# Patient Record
Sex: Female | Born: 1935 | Race: White | Hispanic: No | Marital: Married | Smoking: Never smoker
Health system: Southern US, Community
[De-identification: ages and names within clinical notes are randomized; demographics above are authoritative.]

## PROBLEM LIST (undated history)

## (undated) DIAGNOSIS — K5900 Constipation, unspecified: Secondary | ICD-10-CM

## (undated) DIAGNOSIS — E785 Hyperlipidemia, unspecified: Secondary | ICD-10-CM

## (undated) DIAGNOSIS — I639 Cerebral infarction, unspecified: Secondary | ICD-10-CM

## (undated) DIAGNOSIS — K4090 Unilateral inguinal hernia, without obstruction or gangrene, not specified as recurrent: Secondary | ICD-10-CM

## (undated) DIAGNOSIS — I10 Essential (primary) hypertension: Secondary | ICD-10-CM

## (undated) DIAGNOSIS — I2510 Atherosclerotic heart disease of native coronary artery without angina pectoris: Secondary | ICD-10-CM

## (undated) DIAGNOSIS — Z952 Presence of prosthetic heart valve: Secondary | ICD-10-CM

## (undated) DIAGNOSIS — E119 Type 2 diabetes mellitus without complications: Secondary | ICD-10-CM

## (undated) DIAGNOSIS — J45909 Unspecified asthma, uncomplicated: Secondary | ICD-10-CM

## (undated) DIAGNOSIS — M1990 Unspecified osteoarthritis, unspecified site: Secondary | ICD-10-CM

## (undated) DIAGNOSIS — H409 Unspecified glaucoma: Secondary | ICD-10-CM

## (undated) DIAGNOSIS — Z950 Presence of cardiac pacemaker: Secondary | ICD-10-CM

## (undated) DIAGNOSIS — Z9109 Other allergy status, other than to drugs and biological substances: Secondary | ICD-10-CM

## (undated) HISTORY — PX: HERNIA REPAIR: SHX51

## (undated) HISTORY — DX: Essential (primary) hypertension: I10

## (undated) HISTORY — DX: Presence of prosthetic heart valve: Z95.2

## (undated) HISTORY — DX: Hyperlipidemia, unspecified: E78.5

## (undated) HISTORY — DX: Type 2 diabetes mellitus without complications: E11.9

## (undated) HISTORY — PX: EYE SURGERY: SHX253

## (undated) HISTORY — DX: Unilateral inguinal hernia, without obstruction or gangrene, not specified as recurrent: K40.90

## (undated) HISTORY — PX: TONSILLECTOMY: SUR1361

## (undated) HISTORY — PX: EXPLORATION POST OPERATIVE OPEN HEART: SHX5061

## (undated) HISTORY — DX: Unspecified glaucoma: H40.9

---

## 2004-08-03 HISTORY — PX: AORTIC VALVE REPLACEMENT: SHX41

## 2004-08-03 HISTORY — PX: CORONARY ARTERY BYPASS GRAFT: SHX141

## 2011-08-06 DIAGNOSIS — R05 Cough: Secondary | ICD-10-CM | POA: Diagnosis not present

## 2011-08-06 DIAGNOSIS — I10 Essential (primary) hypertension: Secondary | ICD-10-CM | POA: Diagnosis not present

## 2011-08-06 DIAGNOSIS — Z950 Presence of cardiac pacemaker: Secondary | ICD-10-CM | POA: Diagnosis not present

## 2011-08-06 DIAGNOSIS — I442 Atrioventricular block, complete: Secondary | ICD-10-CM | POA: Diagnosis not present

## 2011-08-06 DIAGNOSIS — I495 Sick sinus syndrome: Secondary | ICD-10-CM | POA: Diagnosis not present

## 2011-08-31 DIAGNOSIS — H4011X0 Primary open-angle glaucoma, stage unspecified: Secondary | ICD-10-CM | POA: Diagnosis not present

## 2011-08-31 DIAGNOSIS — H409 Unspecified glaucoma: Secondary | ICD-10-CM | POA: Diagnosis not present

## 2011-09-15 DIAGNOSIS — M549 Dorsalgia, unspecified: Secondary | ICD-10-CM | POA: Diagnosis not present

## 2011-09-15 DIAGNOSIS — R000 Tachycardia, unspecified: Secondary | ICD-10-CM | POA: Diagnosis not present

## 2011-09-15 DIAGNOSIS — E7800 Pure hypercholesterolemia, unspecified: Secondary | ICD-10-CM | POA: Diagnosis not present

## 2011-09-15 DIAGNOSIS — R030 Elevated blood-pressure reading, without diagnosis of hypertension: Secondary | ICD-10-CM | POA: Diagnosis not present

## 2011-09-15 DIAGNOSIS — R7309 Other abnormal glucose: Secondary | ICD-10-CM | POA: Diagnosis not present

## 2011-09-23 DIAGNOSIS — H4011X0 Primary open-angle glaucoma, stage unspecified: Secondary | ICD-10-CM | POA: Diagnosis not present

## 2011-09-23 DIAGNOSIS — H409 Unspecified glaucoma: Secondary | ICD-10-CM | POA: Diagnosis not present

## 2011-10-12 DIAGNOSIS — R030 Elevated blood-pressure reading, without diagnosis of hypertension: Secondary | ICD-10-CM | POA: Diagnosis not present

## 2011-10-12 DIAGNOSIS — I10 Essential (primary) hypertension: Secondary | ICD-10-CM | POA: Diagnosis not present

## 2011-10-12 DIAGNOSIS — M25519 Pain in unspecified shoulder: Secondary | ICD-10-CM | POA: Diagnosis not present

## 2011-10-12 DIAGNOSIS — Z9889 Other specified postprocedural states: Secondary | ICD-10-CM | POA: Diagnosis not present

## 2011-10-13 DIAGNOSIS — H4011X0 Primary open-angle glaucoma, stage unspecified: Secondary | ICD-10-CM | POA: Diagnosis not present

## 2011-10-13 DIAGNOSIS — H409 Unspecified glaucoma: Secondary | ICD-10-CM | POA: Diagnosis not present

## 2011-11-03 DIAGNOSIS — H409 Unspecified glaucoma: Secondary | ICD-10-CM | POA: Diagnosis not present

## 2011-11-03 DIAGNOSIS — H4011X0 Primary open-angle glaucoma, stage unspecified: Secondary | ICD-10-CM | POA: Diagnosis not present

## 2011-11-03 DIAGNOSIS — H35379 Puckering of macula, unspecified eye: Secondary | ICD-10-CM | POA: Diagnosis not present

## 2011-11-10 DIAGNOSIS — Z950 Presence of cardiac pacemaker: Secondary | ICD-10-CM | POA: Diagnosis not present

## 2011-11-10 DIAGNOSIS — Z954 Presence of other heart-valve replacement: Secondary | ICD-10-CM | POA: Diagnosis not present

## 2011-11-10 DIAGNOSIS — I495 Sick sinus syndrome: Secondary | ICD-10-CM | POA: Diagnosis not present

## 2011-12-14 DIAGNOSIS — H409 Unspecified glaucoma: Secondary | ICD-10-CM | POA: Diagnosis not present

## 2011-12-14 DIAGNOSIS — Z961 Presence of intraocular lens: Secondary | ICD-10-CM | POA: Diagnosis not present

## 2011-12-14 DIAGNOSIS — H4011X0 Primary open-angle glaucoma, stage unspecified: Secondary | ICD-10-CM | POA: Diagnosis not present

## 2012-05-17 DIAGNOSIS — H409 Unspecified glaucoma: Secondary | ICD-10-CM | POA: Diagnosis not present

## 2012-05-17 DIAGNOSIS — H4011X0 Primary open-angle glaucoma, stage unspecified: Secondary | ICD-10-CM | POA: Diagnosis not present

## 2012-08-09 ENCOUNTER — Ambulatory Visit
Admission: RE | Admit: 2012-08-09 | Discharge: 2012-08-09 | Disposition: A | Discharge status: Home / Self Care | Payer: Medicare Other | Source: Ambulatory Visit | Attending: Cardiology | Admitting: Cardiology

## 2012-08-09 ENCOUNTER — Other Ambulatory Visit: Payer: Self-pay | Admitting: Cardiology

## 2012-08-09 DIAGNOSIS — E785 Hyperlipidemia, unspecified: Secondary | ICD-10-CM

## 2012-08-09 DIAGNOSIS — Z954 Presence of other heart-valve replacement: Secondary | ICD-10-CM | POA: Diagnosis not present

## 2012-08-09 DIAGNOSIS — Z950 Presence of cardiac pacemaker: Secondary | ICD-10-CM | POA: Diagnosis not present

## 2012-08-09 DIAGNOSIS — I359 Nonrheumatic aortic valve disorder, unspecified: Secondary | ICD-10-CM | POA: Diagnosis not present

## 2012-08-09 DIAGNOSIS — E119 Type 2 diabetes mellitus without complications: Secondary | ICD-10-CM | POA: Diagnosis not present

## 2012-08-09 DIAGNOSIS — I2510 Atherosclerotic heart disease of native coronary artery without angina pectoris: Secondary | ICD-10-CM | POA: Diagnosis not present

## 2012-08-09 DIAGNOSIS — I4891 Unspecified atrial fibrillation: Secondary | ICD-10-CM | POA: Diagnosis not present

## 2012-08-18 DIAGNOSIS — Z950 Presence of cardiac pacemaker: Secondary | ICD-10-CM | POA: Diagnosis not present

## 2012-08-18 DIAGNOSIS — I4891 Unspecified atrial fibrillation: Secondary | ICD-10-CM | POA: Diagnosis not present

## 2012-08-18 DIAGNOSIS — I2510 Atherosclerotic heart disease of native coronary artery without angina pectoris: Secondary | ICD-10-CM | POA: Diagnosis not present

## 2012-08-18 DIAGNOSIS — I10 Essential (primary) hypertension: Secondary | ICD-10-CM | POA: Diagnosis not present

## 2012-09-01 DIAGNOSIS — E119 Type 2 diabetes mellitus without complications: Secondary | ICD-10-CM | POA: Diagnosis not present

## 2012-09-01 DIAGNOSIS — Z1331 Encounter for screening for depression: Secondary | ICD-10-CM | POA: Diagnosis not present

## 2012-09-01 DIAGNOSIS — Z954 Presence of other heart-valve replacement: Secondary | ICD-10-CM | POA: Diagnosis not present

## 2012-09-01 DIAGNOSIS — M21619 Bunion of unspecified foot: Secondary | ICD-10-CM | POA: Diagnosis not present

## 2012-09-06 DIAGNOSIS — M779 Enthesopathy, unspecified: Secondary | ICD-10-CM | POA: Diagnosis not present

## 2012-09-06 DIAGNOSIS — M19079 Primary osteoarthritis, unspecified ankle and foot: Secondary | ICD-10-CM | POA: Diagnosis not present

## 2012-09-06 DIAGNOSIS — M79609 Pain in unspecified limb: Secondary | ICD-10-CM | POA: Diagnosis not present

## 2012-09-06 DIAGNOSIS — M2010 Hallux valgus (acquired), unspecified foot: Secondary | ICD-10-CM | POA: Diagnosis not present

## 2012-11-16 DIAGNOSIS — H4011X0 Primary open-angle glaucoma, stage unspecified: Secondary | ICD-10-CM | POA: Diagnosis not present

## 2012-11-16 DIAGNOSIS — Z950 Presence of cardiac pacemaker: Secondary | ICD-10-CM | POA: Diagnosis not present

## 2012-11-16 DIAGNOSIS — R0789 Other chest pain: Secondary | ICD-10-CM | POA: Diagnosis not present

## 2012-11-16 DIAGNOSIS — I4891 Unspecified atrial fibrillation: Secondary | ICD-10-CM | POA: Diagnosis not present

## 2012-11-16 DIAGNOSIS — I10 Essential (primary) hypertension: Secondary | ICD-10-CM | POA: Diagnosis not present

## 2012-11-16 DIAGNOSIS — E785 Hyperlipidemia, unspecified: Secondary | ICD-10-CM | POA: Diagnosis not present

## 2012-11-16 DIAGNOSIS — I359 Nonrheumatic aortic valve disorder, unspecified: Secondary | ICD-10-CM | POA: Diagnosis not present

## 2012-11-16 DIAGNOSIS — Z954 Presence of other heart-valve replacement: Secondary | ICD-10-CM | POA: Diagnosis not present

## 2012-11-16 DIAGNOSIS — H409 Unspecified glaucoma: Secondary | ICD-10-CM | POA: Diagnosis not present

## 2012-11-16 DIAGNOSIS — E119 Type 2 diabetes mellitus without complications: Secondary | ICD-10-CM | POA: Diagnosis not present

## 2012-11-16 DIAGNOSIS — I2510 Atherosclerotic heart disease of native coronary artery without angina pectoris: Secondary | ICD-10-CM | POA: Diagnosis not present

## 2012-11-29 ENCOUNTER — Other Ambulatory Visit: Payer: Self-pay

## 2012-11-29 ENCOUNTER — Ambulatory Visit
Admission: RE | Admit: 2012-11-29 | Discharge: 2012-11-29 | Disposition: A | Discharge status: Home / Self Care | Payer: Medicare Other | Source: Ambulatory Visit

## 2012-11-29 DIAGNOSIS — I359 Nonrheumatic aortic valve disorder, unspecified: Secondary | ICD-10-CM | POA: Diagnosis not present

## 2012-11-29 DIAGNOSIS — E1169 Type 2 diabetes mellitus with other specified complication: Secondary | ICD-10-CM | POA: Diagnosis not present

## 2012-11-29 DIAGNOSIS — Z1231 Encounter for screening mammogram for malignant neoplasm of breast: Secondary | ICD-10-CM | POA: Diagnosis not present

## 2012-11-29 DIAGNOSIS — Z954 Presence of other heart-valve replacement: Secondary | ICD-10-CM | POA: Diagnosis not present

## 2012-11-29 DIAGNOSIS — I10 Essential (primary) hypertension: Secondary | ICD-10-CM | POA: Diagnosis not present

## 2012-11-29 DIAGNOSIS — Z1212 Encounter for screening for malignant neoplasm of rectum: Secondary | ICD-10-CM | POA: Diagnosis not present

## 2012-11-29 DIAGNOSIS — I4891 Unspecified atrial fibrillation: Secondary | ICD-10-CM | POA: Diagnosis not present

## 2012-11-29 DIAGNOSIS — M129 Arthropathy, unspecified: Secondary | ICD-10-CM | POA: Diagnosis not present

## 2012-11-29 DIAGNOSIS — Z0000 Encounter for general adult medical examination without abnormal findings: Secondary | ICD-10-CM | POA: Diagnosis not present

## 2012-11-29 DIAGNOSIS — Z950 Presence of cardiac pacemaker: Secondary | ICD-10-CM | POA: Diagnosis not present

## 2012-11-30 ENCOUNTER — Other Ambulatory Visit: Payer: Self-pay | Admitting: Internal Medicine

## 2012-11-30 DIAGNOSIS — R928 Other abnormal and inconclusive findings on diagnostic imaging of breast: Secondary | ICD-10-CM

## 2012-12-13 ENCOUNTER — Ambulatory Visit
Admission: RE | Admit: 2012-12-13 | Discharge: 2012-12-13 | Disposition: A | Discharge status: Home / Self Care | Payer: Medicare Other | Source: Ambulatory Visit | Attending: Internal Medicine | Admitting: Internal Medicine

## 2012-12-13 DIAGNOSIS — R928 Other abnormal and inconclusive findings on diagnostic imaging of breast: Secondary | ICD-10-CM | POA: Diagnosis not present

## 2013-01-11 DIAGNOSIS — M899 Disorder of bone, unspecified: Secondary | ICD-10-CM | POA: Diagnosis not present

## 2013-01-11 DIAGNOSIS — M949 Disorder of cartilage, unspecified: Secondary | ICD-10-CM | POA: Diagnosis not present

## 2013-02-14 DIAGNOSIS — E119 Type 2 diabetes mellitus without complications: Secondary | ICD-10-CM | POA: Diagnosis not present

## 2013-02-14 DIAGNOSIS — E785 Hyperlipidemia, unspecified: Secondary | ICD-10-CM | POA: Diagnosis not present

## 2013-02-14 DIAGNOSIS — Z954 Presence of other heart-valve replacement: Secondary | ICD-10-CM | POA: Diagnosis not present

## 2013-02-14 DIAGNOSIS — R0789 Other chest pain: Secondary | ICD-10-CM | POA: Diagnosis not present

## 2013-02-14 DIAGNOSIS — Z950 Presence of cardiac pacemaker: Secondary | ICD-10-CM | POA: Diagnosis not present

## 2013-02-14 DIAGNOSIS — I359 Nonrheumatic aortic valve disorder, unspecified: Secondary | ICD-10-CM | POA: Diagnosis not present

## 2013-02-14 DIAGNOSIS — I2510 Atherosclerotic heart disease of native coronary artery without angina pectoris: Secondary | ICD-10-CM | POA: Diagnosis not present

## 2013-02-14 DIAGNOSIS — I4891 Unspecified atrial fibrillation: Secondary | ICD-10-CM | POA: Diagnosis not present

## 2013-03-29 DIAGNOSIS — I10 Essential (primary) hypertension: Secondary | ICD-10-CM | POA: Diagnosis not present

## 2013-03-29 DIAGNOSIS — E1169 Type 2 diabetes mellitus with other specified complication: Secondary | ICD-10-CM | POA: Diagnosis not present

## 2013-03-29 DIAGNOSIS — Z6828 Body mass index (BMI) 28.0-28.9, adult: Secondary | ICD-10-CM | POA: Diagnosis not present

## 2013-03-29 DIAGNOSIS — Z23 Encounter for immunization: Secondary | ICD-10-CM | POA: Diagnosis not present

## 2013-03-29 DIAGNOSIS — E785 Hyperlipidemia, unspecified: Secondary | ICD-10-CM | POA: Diagnosis not present

## 2013-03-29 DIAGNOSIS — M810 Age-related osteoporosis without current pathological fracture: Secondary | ICD-10-CM | POA: Diagnosis not present

## 2013-04-25 DIAGNOSIS — I10 Essential (primary) hypertension: Secondary | ICD-10-CM | POA: Diagnosis not present

## 2013-04-25 DIAGNOSIS — M810 Age-related osteoporosis without current pathological fracture: Secondary | ICD-10-CM | POA: Diagnosis not present

## 2013-04-25 DIAGNOSIS — E119 Type 2 diabetes mellitus without complications: Secondary | ICD-10-CM | POA: Diagnosis not present

## 2013-04-25 DIAGNOSIS — Z6828 Body mass index (BMI) 28.0-28.9, adult: Secondary | ICD-10-CM | POA: Diagnosis not present

## 2013-05-09 DIAGNOSIS — R05 Cough: Secondary | ICD-10-CM | POA: Diagnosis not present

## 2013-05-09 DIAGNOSIS — Z6828 Body mass index (BMI) 28.0-28.9, adult: Secondary | ICD-10-CM | POA: Diagnosis not present

## 2013-05-22 DIAGNOSIS — E785 Hyperlipidemia, unspecified: Secondary | ICD-10-CM | POA: Diagnosis not present

## 2013-05-22 DIAGNOSIS — I2510 Atherosclerotic heart disease of native coronary artery without angina pectoris: Secondary | ICD-10-CM | POA: Diagnosis not present

## 2013-05-22 DIAGNOSIS — Z950 Presence of cardiac pacemaker: Secondary | ICD-10-CM | POA: Diagnosis not present

## 2013-05-22 DIAGNOSIS — E119 Type 2 diabetes mellitus without complications: Secondary | ICD-10-CM | POA: Diagnosis not present

## 2013-05-22 DIAGNOSIS — I4891 Unspecified atrial fibrillation: Secondary | ICD-10-CM | POA: Diagnosis not present

## 2013-05-22 DIAGNOSIS — I359 Nonrheumatic aortic valve disorder, unspecified: Secondary | ICD-10-CM | POA: Diagnosis not present

## 2013-05-22 DIAGNOSIS — Z954 Presence of other heart-valve replacement: Secondary | ICD-10-CM | POA: Diagnosis not present

## 2013-05-22 DIAGNOSIS — R0789 Other chest pain: Secondary | ICD-10-CM | POA: Diagnosis not present

## 2013-05-25 DIAGNOSIS — H4011X0 Primary open-angle glaucoma, stage unspecified: Secondary | ICD-10-CM | POA: Diagnosis not present

## 2013-05-25 DIAGNOSIS — H409 Unspecified glaucoma: Secondary | ICD-10-CM | POA: Diagnosis not present

## 2013-06-14 DIAGNOSIS — I10 Essential (primary) hypertension: Secondary | ICD-10-CM | POA: Diagnosis not present

## 2013-06-14 DIAGNOSIS — Z6828 Body mass index (BMI) 28.0-28.9, adult: Secondary | ICD-10-CM | POA: Diagnosis not present

## 2013-06-14 DIAGNOSIS — B029 Zoster without complications: Secondary | ICD-10-CM | POA: Diagnosis not present

## 2013-07-05 DIAGNOSIS — M810 Age-related osteoporosis without current pathological fracture: Secondary | ICD-10-CM | POA: Diagnosis not present

## 2013-07-05 DIAGNOSIS — I359 Nonrheumatic aortic valve disorder, unspecified: Secondary | ICD-10-CM | POA: Diagnosis not present

## 2013-07-05 DIAGNOSIS — Z6827 Body mass index (BMI) 27.0-27.9, adult: Secondary | ICD-10-CM | POA: Diagnosis not present

## 2013-07-05 DIAGNOSIS — K4090 Unilateral inguinal hernia, without obstruction or gangrene, not specified as recurrent: Secondary | ICD-10-CM | POA: Diagnosis not present

## 2013-07-05 DIAGNOSIS — E785 Hyperlipidemia, unspecified: Secondary | ICD-10-CM | POA: Diagnosis not present

## 2013-07-05 DIAGNOSIS — E1169 Type 2 diabetes mellitus with other specified complication: Secondary | ICD-10-CM | POA: Diagnosis not present

## 2013-07-05 DIAGNOSIS — I10 Essential (primary) hypertension: Secondary | ICD-10-CM | POA: Diagnosis not present

## 2013-07-12 DIAGNOSIS — Z950 Presence of cardiac pacemaker: Secondary | ICD-10-CM | POA: Diagnosis not present

## 2013-07-12 DIAGNOSIS — I4891 Unspecified atrial fibrillation: Secondary | ICD-10-CM | POA: Diagnosis not present

## 2013-07-12 DIAGNOSIS — R0789 Other chest pain: Secondary | ICD-10-CM | POA: Diagnosis not present

## 2013-07-12 DIAGNOSIS — E119 Type 2 diabetes mellitus without complications: Secondary | ICD-10-CM | POA: Diagnosis not present

## 2013-07-12 DIAGNOSIS — I2510 Atherosclerotic heart disease of native coronary artery without angina pectoris: Secondary | ICD-10-CM | POA: Diagnosis not present

## 2013-07-12 DIAGNOSIS — Z954 Presence of other heart-valve replacement: Secondary | ICD-10-CM | POA: Diagnosis not present

## 2013-07-12 DIAGNOSIS — E785 Hyperlipidemia, unspecified: Secondary | ICD-10-CM | POA: Diagnosis not present

## 2013-07-12 DIAGNOSIS — I359 Nonrheumatic aortic valve disorder, unspecified: Secondary | ICD-10-CM | POA: Diagnosis not present

## 2013-08-25 DIAGNOSIS — Z954 Presence of other heart-valve replacement: Secondary | ICD-10-CM | POA: Diagnosis not present

## 2013-08-25 DIAGNOSIS — E119 Type 2 diabetes mellitus without complications: Secondary | ICD-10-CM | POA: Diagnosis not present

## 2013-08-25 DIAGNOSIS — E785 Hyperlipidemia, unspecified: Secondary | ICD-10-CM | POA: Diagnosis not present

## 2013-08-25 DIAGNOSIS — I2510 Atherosclerotic heart disease of native coronary artery without angina pectoris: Secondary | ICD-10-CM | POA: Diagnosis not present

## 2013-08-25 DIAGNOSIS — Z950 Presence of cardiac pacemaker: Secondary | ICD-10-CM | POA: Diagnosis not present

## 2013-08-25 DIAGNOSIS — I359 Nonrheumatic aortic valve disorder, unspecified: Secondary | ICD-10-CM | POA: Diagnosis not present

## 2013-08-25 DIAGNOSIS — I4891 Unspecified atrial fibrillation: Secondary | ICD-10-CM | POA: Diagnosis not present

## 2013-09-12 DIAGNOSIS — M810 Age-related osteoporosis without current pathological fracture: Secondary | ICD-10-CM | POA: Diagnosis not present

## 2013-09-12 DIAGNOSIS — Z6828 Body mass index (BMI) 28.0-28.9, adult: Secondary | ICD-10-CM | POA: Diagnosis not present

## 2013-09-12 DIAGNOSIS — Z1331 Encounter for screening for depression: Secondary | ICD-10-CM | POA: Diagnosis not present

## 2013-09-12 DIAGNOSIS — E785 Hyperlipidemia, unspecified: Secondary | ICD-10-CM | POA: Diagnosis not present

## 2013-09-12 DIAGNOSIS — K4090 Unilateral inguinal hernia, without obstruction or gangrene, not specified as recurrent: Secondary | ICD-10-CM | POA: Diagnosis not present

## 2013-09-25 DIAGNOSIS — R918 Other nonspecific abnormal finding of lung field: Secondary | ICD-10-CM | POA: Diagnosis not present

## 2013-09-25 DIAGNOSIS — K4090 Unilateral inguinal hernia, without obstruction or gangrene, not specified as recurrent: Secondary | ICD-10-CM | POA: Diagnosis not present

## 2013-09-25 DIAGNOSIS — R198 Other specified symptoms and signs involving the digestive system and abdomen: Secondary | ICD-10-CM | POA: Diagnosis not present

## 2013-10-02 DIAGNOSIS — Z6828 Body mass index (BMI) 28.0-28.9, adult: Secondary | ICD-10-CM | POA: Diagnosis not present

## 2013-10-02 DIAGNOSIS — K4090 Unilateral inguinal hernia, without obstruction or gangrene, not specified as recurrent: Secondary | ICD-10-CM | POA: Diagnosis not present

## 2013-10-02 DIAGNOSIS — Z954 Presence of other heart-valve replacement: Secondary | ICD-10-CM | POA: Diagnosis not present

## 2013-10-02 DIAGNOSIS — E1169 Type 2 diabetes mellitus with other specified complication: Secondary | ICD-10-CM | POA: Diagnosis not present

## 2013-10-06 ENCOUNTER — Encounter (INDEPENDENT_AMBULATORY_CARE_PROVIDER_SITE_OTHER): Payer: Self-pay

## 2013-10-06 ENCOUNTER — Encounter (INDEPENDENT_AMBULATORY_CARE_PROVIDER_SITE_OTHER): Payer: Self-pay | Admitting: General Surgery

## 2013-10-06 ENCOUNTER — Ambulatory Visit (INDEPENDENT_AMBULATORY_CARE_PROVIDER_SITE_OTHER): Payer: Medicare Other | Admitting: General Surgery

## 2013-10-06 VITALS — BP 138/80 | HR 80 | Temp 99.1°F | Resp 12 | Ht 67.0 in | Wt 167.4 lb

## 2013-10-06 DIAGNOSIS — K4090 Unilateral inguinal hernia, without obstruction or gangrene, not specified as recurrent: Secondary | ICD-10-CM

## 2013-10-06 NOTE — Patient Instructions (Signed)
Treat the constipation as follows: Increase water intake, increase fiber in diet (raw fruits and vegetables), take 1 dose of MiraLAX daily, drink one glass of warm prune-juice every morning.  If this does not work, please let me know.    [facility] _______[facility]  INGUINAL HERNIA REPAIR: POST OP INSTRUCTIONS  Always review your discharge instruction sheet given to you by the facility where your surgery was performed. IF YOU HAVE DISABILITY OR FAMILY LEAVE FORMS, YOU MUST BRING THEM TO THE OFFICE FOR PROCESSING.   DO NOT GIVE THEM TO YOUR DOCTOR.  1. A  prescription for pain medication may be given to you upon discharge.  Take your pain medication as prescribed, if needed.  If narcotic pain medicine is not needed, then you may take acetaminophen (Tylenol) or ibuprofen (Advil) as needed. 2. Take your usually prescribed medications unless otherwise directed. 3. If you need a refill on your pain medication, please contact your pharmacy.  They will contact our office to request authorization. Prescriptions will not be filled after 5 pm or on week-ends. 4. You should follow a light diet the first 24 hours after arrival home, such as soup and crackers, etc.  Be sure to include lots of fluids daily.  Resume your normal diet the day after surgery. 5. Most patients will experience some swelling and bruising around the groin.  Ice packs and reclining will help.  Swelling and bruising can take several days to resolve.  6. It is common to experience some constipation if taking pain medication after surgery.  Increasing fluid intake and taking a stool softener (such as Colace) will usually help or prevent this problem from occurring.  A mild laxative (Milk of Magnesia or Miralax) should be taken according to package directions if there are no bowel movements after 48 hours. 7. Unless discharge instructions indicate otherwise, you may remove your bandages 72 hours after surgery, and you may shower  at that time.  You may have steri-strips (small skin tapes) in place directly over the incision.  These strips should be left on the skin for 7-10 days.  If your surgeon used skin glue on the incision, you may shower in 24 hours.  The glue will flake off over the next 2-3 weeks.  Any sutures or staples will be removed at the office during your follow-up visit. 8. ACTIVITIES:  You may resume regular (light) daily activities beginning the next day-such as daily self-care, walking, climbing stairs-gradually increasing activities as tolerated.  You may have sexual intercourse when it is comfortable.  Refrain from any heavy lifting or straining-nothing over 10 pounds for 6 weeks.  a. You may drive when you are no longer taking prescription pain medication, you can comfortably wear a seatbelt, and you can safely maneuver your car and apply brakes. b. RETURN TO WORK:  __________________________________________________________ 9. You should see your doctor in the office for a follow-up appointment approximately 2-3 weeks after your surgery.  Make sure that you call for this appointment within a day or two after you arrive home to insure a convenient appointment time. 10. OTHER INSTRUCTIONS:  __________________________________________________________________________________________________________________________________________________________________________________________  WHEN TO CALL YOUR DOCTOR: 1. Fever over 101.0 2. Inability to urinate 3. Nausea and/or vomiting 4. Extreme swelling or bruising 5. Continued bleeding from incision. 6. Increased pain, redness, or drainage from the incision  The clinic staff is available to answer your questions during regular business hours.  Please don't hesitate to call and ask to speak to one of  the nurses for clinical concerns.  If you have a medical emergency, go to the nearest emergency room or call 911.  A surgeon from [facility] is always on call at  the hospital   [address], [place], [place], [place]  [address] ?  [address], [place], [place]   [address] [phone] ? [phone] ? FAX [phone] Web site: [email or web address]

## 2013-10-06 NOTE — Progress Notes (Addendum)
Patient ID: Jeannie Fend, female   DOB: 1933-02-19, 80 y.o.   MRN: [id number]  Chief Complaint  Patient presents with  . New Evaluation    eval RIH with pain    HPI Evelyn Collins is a 80 y.o. female.   HPI  She is referred by Dr. Ardeth Perfect for further evaluation and treatment of a right inguinal hernia.  About 6 months ago, she noticed the small right groin bulge that was mildly uncomfortable. The bulge has persisted, becoming slightly larger, and the discomfort remains.  The bulge goes away in the supine position.  CT scan by report demonstrates a right inguinal hernia containing fat.  She has chronic constipation and has to strain to have a bowel movement. She thinks this is the cause of the hernia.  Past Medical History  Diagnosis Date  . H/O aortic valve replacement   . Inguinal hernia   . Hyperlipidemia   . Diabetes mellitus without complication   . Glaucoma   . Hypertension     Past Surgical History  Procedure Laterality Date  . Hernia repair      umbilical hernia x2  . Tonsillectomy    . Exploration post operative open heart      History reviewed. No pertinent family history.  Social History History  Substance Use Topics  . Smoking status: Never Smoker   . Smokeless tobacco: Never Used  . Alcohol Use: No    Not on File  Current Outpatient Prescriptions  Medication Sig Dispense Refill  . ACCU-CHEK FASTCLIX LANCETS MISC by Does not apply route.      Marland Kitchen alendronate (FOSAMAX) 70 MG tablet Take 70 mg by mouth once a week. Take with a full glass of water on an empty stomach.      Marland Kitchen aspirin EC 81 MG tablet Take 81 mg by mouth daily. Takes 2 tablets.      . cholecalciferol (VITAMIN D) 1000 UNITS tablet Take 1,000 Units by mouth daily. Two tablets      . docusate sodium (COLACE) 100 MG capsule Take 100 mg by mouth 2 (two) times daily.      . Dorzolamide HCl-Timolol Mal PF 22.3-6.8 MG/ML SOLN Apply 1 drop to eye. 1 drop in each eye daily      .  Glucos-Chondroit-Hyaluron-MSM (GLUCOSAMINE CHONDROITIN JOINT PO) Take by mouth daily. Two tablets      . losartan (COZAAR) 25 MG tablet Take 25 mg by mouth daily.      . metFORMIN (GLUCOPHAGE) 1000 MG tablet Take 1,000 mg by mouth 2 (two) times daily with a meal.      . Multiple Vitamin (MULTIVITAMIN) tablet Take 1 tablet by mouth daily.      . Omega-3 Fatty Acids (FISH OIL PO) Take 1,000 mg by mouth daily.      . rosuvastatin (CRESTOR) 20 MG tablet Take 20 mg by mouth daily.      . travoprost, benzalkonium, (TRAVATAN) 0.004 % ophthalmic solution Place 1 drop into both eyes at bedtime.       No current facility-administered medications for this visit.    Review of Systems Review of Systems  Constitutional: Negative.   Gastrointestinal: Positive for constipation.  Genitourinary: Negative for difficulty urinating.       Right groin pain   Hematological: Bruises/bleeds easily.    Blood pressure 138/80, pulse 80, temperature 99.1 F (37.3 C), temperature source Oral, resp. rate 12, height 5\' 7"  (1.702 m), weight 167 lb 6.4 oz (75.932 kg).  Physical Exam Physical Exam  Constitutional: No distress.  Overweight female   HENT:  Head: Normocephalic and atraumatic.  Cardiovascular: Normal rate and regular rhythm.   Murmur heard. Pulmonary/Chest:  Midsternal scar. Palpable pacemaker in the left upper chest.  Abdominal: Soft. There is no tenderness.  Small subumbilical incision.  Genitourinary:  Right inguinal bulge reducible in the supine position. No left inguinal bulge.  Musculoskeletal: She exhibits no edema.  Skin: Skin is warm and dry.  Psychiatric: She has a normal mood and affect. Her behavior is normal.    Data Reviewed Dr. Hoover Brunette note.  Assessment    1. Symptomatic right inguinal hernia 2. Chronic constipation which is felt to be the reason for the hernia 3. Status post aortic valve repair and postoperative heart block requiring pacemaker placement 4. Type 2  diabetes mellitus.     Plan    We discussed open right inguinal hernia repair with mesh.  However, we'll need to try to resolve the constipation as this would be a major risk factor for hernia recurrence. We'll also need Drs. Tilley's input on her risk factor for surgery from a cardiovascular standpoint. Once these 2 things are done, we can schedule the surgery.  I have explained the procedure, risks, and aftercare of inguinal hernia repair.  Risks include but are not limited to bleeding, infection, wound problems, anesthesia, recurrence, bladder or intestine injury, urinary retention, testicular dysfunction, chronic pain, mesh problems.  She seems to understand and agrees to proceed.       Senan Urey J 10/06/2013, 2:50 PM

## 2013-10-10 ENCOUNTER — Telehealth (INDEPENDENT_AMBULATORY_CARE_PROVIDER_SITE_OTHER): Payer: Self-pay

## 2013-10-10 NOTE — Telephone Encounter (Signed)
Noted  

## 2013-10-10 NOTE — Telephone Encounter (Signed)
Received note from Dr. Wynonia Lawman requesting the patient call his office to schedule an appointment for cardiac clearance.  Made the patient aware, and she will call them today.  She also stated that she tried the Miralax and it seemed to have helped.  She is also drinking prune juice and drinking plenty of water.  I told her to keep it up since it was working.  Will f/u up with Dr. Thurman Coyer office for appointment date.

## 2013-10-13 ENCOUNTER — Encounter: Payer: Self-pay | Admitting: Cardiology

## 2013-10-13 DIAGNOSIS — I4891 Unspecified atrial fibrillation: Secondary | ICD-10-CM | POA: Diagnosis not present

## 2013-10-13 DIAGNOSIS — I2510 Atherosclerotic heart disease of native coronary artery without angina pectoris: Secondary | ICD-10-CM | POA: Diagnosis not present

## 2013-10-13 DIAGNOSIS — Z01810 Encounter for preprocedural cardiovascular examination: Secondary | ICD-10-CM | POA: Diagnosis not present

## 2013-10-13 DIAGNOSIS — I359 Nonrheumatic aortic valve disorder, unspecified: Secondary | ICD-10-CM | POA: Diagnosis not present

## 2013-10-13 DIAGNOSIS — Z950 Presence of cardiac pacemaker: Secondary | ICD-10-CM | POA: Diagnosis not present

## 2013-10-13 DIAGNOSIS — Z954 Presence of other heart-valve replacement: Secondary | ICD-10-CM | POA: Diagnosis not present

## 2013-10-13 DIAGNOSIS — E119 Type 2 diabetes mellitus without complications: Secondary | ICD-10-CM | POA: Diagnosis not present

## 2013-10-13 DIAGNOSIS — E785 Hyperlipidemia, unspecified: Secondary | ICD-10-CM | POA: Diagnosis not present

## 2013-10-13 NOTE — Progress Notes (Unsigned)
Patient ID: Evelyn Collins, female   DOB: 31-Mar-1933, 80 y.o.   MRN: [id number]   Evelyn, Collins    Date of visit:  10/13/2013 DOB:  [birth year]    Age:  80 yrs. Medical record number:  [id number]     Account number:  [id number] Primary Care Provider: Velna Hatchet L ____________________________ CURRENT DIAGNOSES  1. Surgery-S/P AVR  2. Pre-Op Cardiovascular Exam  3. Aortic Valve-Stenosis  4. Arrhythmia-Atrial Fibrillation  5. CAD,Native  6. Pacemaker/cardiac insitu  7. Hyperlipidemia  8. Diabetes Mellitus-NIDD ____________________________ ALLERGIES  Alphagan P, Intolerance-unknown ____________________________ MEDICATIONS  1. Glucosamine 500 mg tablet, BID  2. Crestor 20 mg tablet, 10mg  alternate 20mg   3. Fish Oil 1,000 mg capsule, 1 p.o. daily  4. docusate sodium 100 mg tablet, BID  5. dorzolamide-timolol 2-0.5 % drops, 1 gtt ou bid  6. Vitamin D3 2,000 unit tablet, BID  7. losartan 50 mg tablet, 1/2 tab daily  8. metformin 500 mg tablet, 2 p.o. b.i.d.  9. Calcium 600 600 mg (1,500 mg) tablet, BID  10. Fosamax 70 mg tablet, weekly  11. aspirin 81 mg chewable tablet, 2 p.o. daily  12. multivitamin tablet, qod  13. latanoprost 0.005 % eye drops, 1 qtt ou qd ____________________________ CHIEF COMPLAINTS Pre-Op Cardiovascular Exam ____________________________ HISTORY OF PRESENT ILLNESS Patient seen for preoperative cardiac evaluation. The patient has developed an inguinal hernia and is in need of repair. She has a prior history of an aortic valve replacement and has moderate prosthetic valve restenosis but has been asymptomatic. She has been in sinus rhythm. She also has a functioning permanent pacemaker. She is asymptomatic from a cardiac viewpoint and has no shortness of breath, PND, orthopnea, or angina. ____________________________ PAST HISTORY  Past Medical Illnesses:  DM-non-insulin dependent, hyperlipidemia, CVA;  Cardiovascular Illnesses:  aortic stenosis, CAD, atrial  fibrillation, conduction disorder-LBBB;  Surgical Procedures:  cataract extraction OU, hernia repair, tonsillectomy;  Cardiology Procedures-Invasive:  AVR 09/28/2007 23 [place]  AVR Dr. [facility]ella [facility], cardiac cath (left) January 2009, [employer]tr[employer] pacemaker implant March 2009;  Cardiology Procedures-Noninvasive:  echocardiogram October 2012, echocardiogram January 2014, treadmill Myoview March 2014, echocardiogram December 2014;  Cardiac Cath Results:  scattered irregularities;  LVEF of 55% documented via echocardiogram on 08/17/2012,   ____________________________ CARDIO-PULMONARY TEST DATES EKG Date:  10/13/2013;   Cardiac Cath Date:  08/25/2007;  CABG: 09/28/2007;  Nuclear Study Date:  10/06/2012;  Echocardiography Date: 07/12/2013;  Chest Xray Date: 08/09/2012;   ____________________________ FAMILY HISTORY Brother -- Cancer, Brother dead Father -- Parkinsonism, Father dead Mother -- Unknown Disease, Mother dead Sister -- Sister alive and well ____________________________ SOCIAL HISTORY Alcohol Use:  no alcohol use;  Smoking:  never smoked;  Diet:  regular diet;  Lifestyle:  married;  Exercise:  no regular exercise;  Occupation:  retired, Pharmacist, community;  Residence:  lives with husband;   ____________________________ REVIEW OF SYSTEMS General:  malaise and fatigue Eyes: cataract extraction bilaterally, glaucoma Respiratory: denies dyspnea, cough, wheezing or hemoptysis. Cardiovascular:  please review HPI Abdominal: constipationGenitourinary-Female: stress incontinence Musculoskeletal:  arthritis of the hips and knees Neurological:  denies headaches, stroke, or TIA  ____________________________ PHYSICAL EXAMINATION VITAL SIGNS  Blood Pressure:  124/80 Sitting, Left arm, regular cuff  , 120/80 Standing, Left arm and regular cuff   Pulse:  76/min. Weight:  169.00 lbs. Height:  66"BMI: 27  Constitutional:  pleasant white female, in no acute distress Skin:  warm and dry to touch, no apparent skin  lesions, or masses noted. Head:  normocephalic, normal  hair pattern, no masses or tenderness ENT:  ears, nose and throat reveal no gross abnormalities.  Dentition good. Neck:  supple, without massess. No JVD, thyromegaly or carotid bruits. Carotid upstroke normal. Chest:  healed median sternotomy scar, healed pacemaker incision in the left pectoral area, clear to auscultation Cardiac:  regular rhythm, normal S1 and S2, no S3 or S4, grade 2/6 systolic murmur at aortic area radiating to neck Peripheral Pulses:  the femoral,dorsalis pedis, and posterior tibial pulses are full and equal bilaterally with no bruits auscultated. Extremities & Back:  mild bilateral venous insufficiency changes present Neurological:  no gross motor or sensory deficits noted, affect appropriate, oriented x3. ____________________________ MOST RECENT LIPID PANEL 08/09/12  CHOL TOTL 185 mg/dl, LDL 91 calc, HDL 58 mg/dl, TRIGLYCER 183 mg/dl and CHOL/HDL 3.2 (Calc) ____________________________ IMPRESSIONS/PLAN  1. From a cardiovascular viewpoint, it is acceptable to proceed with her planned hernia surgery. Her cardiac risk should be average at this time. NO further cardiac testing is necessary.  Prior ECHO showed good LV with moderate prosthetic aortic stenosis 2. Previous aortic valve replacement for aortic stenosis 3. Functioning permanent pacemaker ____________________________ TODAYS ORDERS  1. 12 Lead EKG: Today  - sinus with LBBB  pattern                       ____________________________ Cardiology Physician:  Kerry Hough MD [facility]

## 2013-10-17 ENCOUNTER — Encounter (INDEPENDENT_AMBULATORY_CARE_PROVIDER_SITE_OTHER): Payer: Self-pay

## 2013-10-18 ENCOUNTER — Encounter (INDEPENDENT_AMBULATORY_CARE_PROVIDER_SITE_OTHER): Payer: Self-pay

## 2013-10-19 ENCOUNTER — Encounter (INDEPENDENT_AMBULATORY_CARE_PROVIDER_SITE_OTHER): Payer: Self-pay

## 2013-10-19 ENCOUNTER — Encounter (INDEPENDENT_AMBULATORY_CARE_PROVIDER_SITE_OTHER): Payer: Self-pay | Admitting: General Surgery

## 2013-10-19 ENCOUNTER — Other Ambulatory Visit (INDEPENDENT_AMBULATORY_CARE_PROVIDER_SITE_OTHER): Payer: Self-pay | Admitting: General Surgery

## 2013-10-19 NOTE — Progress Notes (Signed)
Patient ID: Evelyn Collins, female   DOB: Mar 30, 1933, 80 y.o.   MRN: [id number] Preop cardiac visit is complete and she is felt to be an acceptable risk for surgery.  Constipation is better as well.  Will work on scheduling the surgery.

## 2013-10-20 ENCOUNTER — Telehealth (INDEPENDENT_AMBULATORY_CARE_PROVIDER_SITE_OTHER): Payer: Self-pay

## 2013-10-20 NOTE — Telephone Encounter (Signed)
Notified pt of Dr. Bertrum Sol response.  She will call Monday morning to reschedule her surgery for a later date.

## 2013-10-20 NOTE — Telephone Encounter (Signed)
Pt is scheduled for inguinal hernia repair on 11/10/13.  She is traveling to [place] on 4/25 for a three week visit with family with a lot of activities planned.  I told her I thought this was too soon after surgery, but I would ask Dr. Zella Richer and call her back later today.  Pt. Agreed.

## 2013-10-20 NOTE — Telephone Encounter (Signed)
She will need to be on light activities for 6 weeks.

## 2013-10-23 ENCOUNTER — Telehealth (INDEPENDENT_AMBULATORY_CARE_PROVIDER_SITE_OTHER): Payer: Self-pay | Admitting: General Surgery

## 2013-10-23 NOTE — Telephone Encounter (Signed)
Pt calling to schedule surgery - see note that she needs cardiac clearance  Is that completed? If so orders are in [facility] will need face sheet  Thanks

## 2013-10-23 NOTE — Telephone Encounter (Signed)
Cardiac clearance has been given by Dr. Wynonia Lawman.

## 2013-11-21 DIAGNOSIS — E119 Type 2 diabetes mellitus without complications: Secondary | ICD-10-CM | POA: Diagnosis not present

## 2013-11-21 DIAGNOSIS — E785 Hyperlipidemia, unspecified: Secondary | ICD-10-CM | POA: Diagnosis not present

## 2013-11-21 DIAGNOSIS — I4891 Unspecified atrial fibrillation: Secondary | ICD-10-CM | POA: Diagnosis not present

## 2013-11-21 DIAGNOSIS — Z950 Presence of cardiac pacemaker: Secondary | ICD-10-CM | POA: Diagnosis not present

## 2013-11-21 DIAGNOSIS — Z954 Presence of other heart-valve replacement: Secondary | ICD-10-CM | POA: Diagnosis not present

## 2013-11-21 DIAGNOSIS — I359 Nonrheumatic aortic valve disorder, unspecified: Secondary | ICD-10-CM | POA: Diagnosis not present

## 2013-11-21 DIAGNOSIS — I2510 Atherosclerotic heart disease of native coronary artery without angina pectoris: Secondary | ICD-10-CM | POA: Diagnosis not present

## 2013-11-21 DIAGNOSIS — Z01810 Encounter for preprocedural cardiovascular examination: Secondary | ICD-10-CM | POA: Diagnosis not present

## 2013-11-23 DIAGNOSIS — H409 Unspecified glaucoma: Secondary | ICD-10-CM | POA: Diagnosis not present

## 2013-11-23 DIAGNOSIS — H4011X0 Primary open-angle glaucoma, stage unspecified: Secondary | ICD-10-CM | POA: Diagnosis not present

## 2013-11-29 ENCOUNTER — Telehealth (INDEPENDENT_AMBULATORY_CARE_PROVIDER_SITE_OTHER): Payer: Self-pay

## 2013-11-29 ENCOUNTER — Encounter (INDEPENDENT_AMBULATORY_CARE_PROVIDER_SITE_OTHER): Payer: Medicare Other | Admitting: General Surgery

## 2013-11-29 NOTE — Telephone Encounter (Signed)
LMOV pt to call and ask for [place].   

## 2013-12-04 NOTE — Telephone Encounter (Signed)
Made pt aware that no additional CT scans needed before her surgery on 12/21/13.

## 2013-12-15 ENCOUNTER — Encounter (HOSPITAL_COMMUNITY): Payer: Self-pay | Admitting: Pharmacist

## 2013-12-15 DIAGNOSIS — R059 Cough, unspecified: Secondary | ICD-10-CM | POA: Diagnosis not present

## 2013-12-15 DIAGNOSIS — J309 Allergic rhinitis, unspecified: Secondary | ICD-10-CM | POA: Diagnosis not present

## 2013-12-15 DIAGNOSIS — R05 Cough: Secondary | ICD-10-CM | POA: Diagnosis not present

## 2013-12-15 DIAGNOSIS — Z6827 Body mass index (BMI) 27.0-27.9, adult: Secondary | ICD-10-CM | POA: Diagnosis not present

## 2013-12-18 ENCOUNTER — Encounter (HOSPITAL_COMMUNITY)
Admission: RE | Admit: 2013-12-18 | Discharge: 2013-12-18 | Disposition: A | Discharge status: Home / Self Care | Payer: Medicare Other | Source: Ambulatory Visit | Attending: General Surgery | Admitting: General Surgery

## 2013-12-18 ENCOUNTER — Encounter (HOSPITAL_COMMUNITY): Payer: Self-pay

## 2013-12-18 ENCOUNTER — Ambulatory Visit (HOSPITAL_COMMUNITY)
Admission: RE | Admit: 2013-12-18 | Discharge: 2013-12-18 | Disposition: A | Discharge status: Home / Self Care | Payer: Medicare Other | Source: Ambulatory Visit | Attending: Anesthesiology | Admitting: Anesthesiology

## 2013-12-18 DIAGNOSIS — E119 Type 2 diabetes mellitus without complications: Secondary | ICD-10-CM | POA: Diagnosis not present

## 2013-12-18 DIAGNOSIS — I10 Essential (primary) hypertension: Secondary | ICD-10-CM | POA: Diagnosis not present

## 2013-12-18 DIAGNOSIS — Z954 Presence of other heart-valve replacement: Secondary | ICD-10-CM | POA: Diagnosis not present

## 2013-12-18 DIAGNOSIS — Z01818 Encounter for other preprocedural examination: Secondary | ICD-10-CM | POA: Insufficient documentation

## 2013-12-18 DIAGNOSIS — Z01812 Encounter for preprocedural laboratory examination: Secondary | ICD-10-CM | POA: Diagnosis not present

## 2013-12-18 DIAGNOSIS — Z950 Presence of cardiac pacemaker: Secondary | ICD-10-CM | POA: Insufficient documentation

## 2013-12-18 HISTORY — DX: Cerebral infarction, unspecified: I63.9

## 2013-12-18 HISTORY — DX: Constipation, unspecified: K59.00

## 2013-12-18 HISTORY — DX: Unspecified osteoarthritis, unspecified site: M19.90

## 2013-12-18 HISTORY — DX: Presence of cardiac pacemaker: Z95.0

## 2013-12-18 HISTORY — DX: Other allergy status, other than to drugs and biological substances: Z91.09

## 2013-12-18 HISTORY — DX: Atherosclerotic heart disease of native coronary artery without angina pectoris: I25.10

## 2013-12-18 HISTORY — DX: Unspecified asthma, uncomplicated: J45.909

## 2013-12-18 LAB — PROTIME-INR
INR: 0.9 (ref 0.00–1.49)
Prothrombin Time: 12 seconds (ref 11.6–15.2)

## 2013-12-18 LAB — COMPREHENSIVE METABOLIC PANEL
ALT: 16 U/L (ref 0–35)
AST: 15 U/L (ref 0–37)
Albumin: 4.1 g/dL (ref 3.5–5.2)
Alkaline Phosphatase: 47 U/L (ref 39–117)
BILIRUBIN TOTAL: 0.4 mg/dL (ref 0.3–1.2)
BUN: 13 mg/dL (ref 6–23)
CO2: 28 meq/L (ref 19–32)
CREATININE: 0.79 mg/dL (ref 0.50–1.10)
Calcium: 9.9 mg/dL (ref 8.4–10.5)
Chloride: 107 mEq/L (ref 96–112)
GFR calc Af Amer: 89 mL/min — ABNORMAL LOW (ref >90)
GFR calc non Af Amer: 77 mL/min — ABNORMAL LOW (ref >90)
Glucose, Bld: 88 mg/dL (ref 70–99)
Potassium: 4.2 mEq/L (ref 3.7–5.3)
Sodium: 144 mEq/L (ref 137–147)
Total Protein: 6.9 g/dL (ref 6.0–8.3)

## 2013-12-18 LAB — CBC WITH DIFFERENTIAL/PLATELET
BASOS ABS: 0 10*3/uL (ref 0.0–0.1)
BASOS PCT: 1 % (ref 0–1)
Eosinophils Absolute: 0.4 10*3/uL (ref 0.0–0.7)
Eosinophils Relative: 4 % (ref 0–5)
HEMATOCRIT: 40 % (ref 36.0–46.0)
Hemoglobin: 12.9 g/dL (ref 12.0–15.0)
LYMPHS PCT: 23 % (ref 12–46)
Lymphs Abs: 1.9 10*3/uL (ref 0.7–4.0)
MCH: 28 pg (ref 26.0–34.0)
MCHC: 32.3 g/dL (ref 30.0–36.0)
MCV: 86.8 fL (ref 78.0–100.0)
MONO ABS: 0.5 10*3/uL (ref 0.1–1.0)
Monocytes Relative: 6 % (ref 3–12)
NEUTROS ABS: 5.6 10*3/uL (ref 1.7–7.7)
Neutrophils Relative %: 66 % (ref 43–77)
Platelets: 220 10*3/uL (ref 150–400)
RBC: 4.61 MIL/uL (ref 3.87–5.11)
RDW: 13.7 % (ref 11.5–15.5)
WBC: 8.5 10*3/uL (ref 4.0–10.5)

## 2013-12-18 NOTE — Pre-Procedure Instructions (Signed)
Evelyn Collins  12/18/2013   Your procedure is scheduled on:  Thursday, May 21  Report to [facility] Admitting at 9:30 AM.  Call this number if you have problems the morning of surgery: [phone]   Remember:   Do not eat food or drink liquids after midnight Wednesday, May 20.   Take these medicines the morning of surgery with A SIP OF WATER: None.  May use inhaler and bring it to the hospital with you.               Stop taking Aspirin, Coumadin, Plavix, Effient and Herbal medications.  Do not take any NSAIDs ie: Ibuprofen,  Advil,Naproxen or any medication containing Aspirin.   Do not wear jewelry, make-up or nail polish.  Do not wear lotions, powders, or perfumes.   Do not shave 48 hours prior to surgery.  Do not bring valuables to the hospital.             [facility] is not responsible for any belongings or valuables.               Contacts, dentures or bridgework may not be worn into surgery.  Leave suitcase in the car. After surgery it may be brought to your room.  For patients admitted to the hospital, discharge time is determined by your treatment team.               Patients discharged the day of surgery will not be allowed to drive home.  Name and phone number of your driver: -   Special Instructions: Review  [facility] - Preparing For Surgery.   Please read over the following fact sheets that you were given: Pain Booklet, Coughing and Deep Breathing and Surgical Site Infection Prevention

## 2013-12-19 NOTE — Progress Notes (Signed)
Call to Dr. Thurman Coyer office, currently on service, will call back later.

## 2013-12-20 MED ORDER — CEFAZOLIN SODIUM-DEXTROSE 2-3 GM-% IV SOLR
2.0000 g | INTRAVENOUS | Status: AC
  Administered 2013-12-21: 2 g via INTRAVENOUS
  Filled 2013-12-20: qty 50

## 2013-12-20 NOTE — Progress Notes (Signed)
Dr Thurman Coyer office called and requested periop cardiac devise programming orders.  Service took info and will send over after office opens back up.

## 2013-12-21 ENCOUNTER — Ambulatory Visit (HOSPITAL_COMMUNITY): Payer: Medicare Other | Admitting: Certified Registered Nurse Anesthetist

## 2013-12-21 ENCOUNTER — Encounter (HOSPITAL_COMMUNITY): Payer: Medicare Other | Admitting: Certified Registered Nurse Anesthetist

## 2013-12-21 ENCOUNTER — Encounter (HOSPITAL_COMMUNITY)
Admission: RE | Disposition: A | Discharge status: Home / Self Care | Payer: Self-pay | Source: Ambulatory Visit | Attending: General Surgery

## 2013-12-21 ENCOUNTER — Ambulatory Visit (HOSPITAL_COMMUNITY)
Admission: RE | Admit: 2013-12-21 | Discharge: 2013-12-21 | Disposition: A | Discharge status: Home / Self Care | Payer: Medicare Other | Source: Ambulatory Visit | Attending: General Surgery | Admitting: General Surgery

## 2013-12-21 ENCOUNTER — Encounter (HOSPITAL_COMMUNITY): Payer: Self-pay | Admitting: *Deleted

## 2013-12-21 DIAGNOSIS — Z7983 Long term (current) use of bisphosphonates: Secondary | ICD-10-CM | POA: Insufficient documentation

## 2013-12-21 DIAGNOSIS — G8918 Other acute postprocedural pain: Secondary | ICD-10-CM | POA: Diagnosis not present

## 2013-12-21 DIAGNOSIS — Z79899 Other long term (current) drug therapy: Secondary | ICD-10-CM | POA: Insufficient documentation

## 2013-12-21 DIAGNOSIS — Z7982 Long term (current) use of aspirin: Secondary | ICD-10-CM | POA: Insufficient documentation

## 2013-12-21 DIAGNOSIS — E785 Hyperlipidemia, unspecified: Secondary | ICD-10-CM | POA: Diagnosis not present

## 2013-12-21 DIAGNOSIS — Z950 Presence of cardiac pacemaker: Secondary | ICD-10-CM | POA: Insufficient documentation

## 2013-12-21 DIAGNOSIS — Z951 Presence of aortocoronary bypass graft: Secondary | ICD-10-CM | POA: Diagnosis not present

## 2013-12-21 DIAGNOSIS — J45909 Unspecified asthma, uncomplicated: Secondary | ICD-10-CM | POA: Insufficient documentation

## 2013-12-21 DIAGNOSIS — Z954 Presence of other heart-valve replacement: Secondary | ICD-10-CM | POA: Diagnosis not present

## 2013-12-21 DIAGNOSIS — I10 Essential (primary) hypertension: Secondary | ICD-10-CM | POA: Insufficient documentation

## 2013-12-21 DIAGNOSIS — E119 Type 2 diabetes mellitus without complications: Secondary | ICD-10-CM | POA: Diagnosis not present

## 2013-12-21 DIAGNOSIS — K4090 Unilateral inguinal hernia, without obstruction or gangrene, not specified as recurrent: Secondary | ICD-10-CM | POA: Insufficient documentation

## 2013-12-21 DIAGNOSIS — Z8673 Personal history of transient ischemic attack (TIA), and cerebral infarction without residual deficits: Secondary | ICD-10-CM | POA: Diagnosis not present

## 2013-12-21 DIAGNOSIS — K5900 Constipation, unspecified: Secondary | ICD-10-CM | POA: Diagnosis not present

## 2013-12-21 DIAGNOSIS — I2510 Atherosclerotic heart disease of native coronary artery without angina pectoris: Secondary | ICD-10-CM | POA: Diagnosis not present

## 2013-12-21 HISTORY — PX: INSERTION OF MESH: SHX5868

## 2013-12-21 HISTORY — PX: INGUINAL HERNIA REPAIR: SHX194

## 2013-12-21 LAB — GLUCOSE, CAPILLARY
Glucose-Capillary: 120 mg/dL — ABNORMAL HIGH (ref 70–99)
Glucose-Capillary: 120 mg/dL — ABNORMAL HIGH (ref 70–99)

## 2013-12-21 SURGERY — REPAIR, HERNIA, INGUINAL, ADULT
Anesthesia: General | Site: Groin | Laterality: Right

## 2013-12-21 MED ORDER — FENTANYL CITRATE 0.05 MG/ML IJ SOLN
INTRAMUSCULAR | Status: AC
  Administered 2013-12-21: 50 ug via INTRAVENOUS
  Filled 2013-12-21: qty 2

## 2013-12-21 MED ORDER — BUPIVACAINE-EPINEPHRINE (PF) 0.5% -1:200000 IJ SOLN
INTRAMUSCULAR | Status: AC
  Filled 2013-12-21: qty 30

## 2013-12-21 MED ORDER — MIDAZOLAM HCL 2 MG/2ML IJ SOLN
INTRAMUSCULAR | Status: AC
  Filled 2013-12-21: qty 2

## 2013-12-21 MED ORDER — FENTANYL CITRATE 0.05 MG/ML IJ SOLN
25.0000 ug | INTRAMUSCULAR | Status: DC | PRN
Start: 2013-12-21 — End: 2013-12-21

## 2013-12-21 MED ORDER — PROPOFOL 10 MG/ML IV BOLUS
INTRAVENOUS | Status: AC
  Filled 2013-12-21: qty 20

## 2013-12-21 MED ORDER — PHENYLEPHRINE HCL 10 MG/ML IJ SOLN
INTRAMUSCULAR | Status: DC | PRN
  Administered 2013-12-21 (×2): 40 ug via INTRAVENOUS

## 2013-12-21 MED ORDER — LIDOCAINE HCL (CARDIAC) 20 MG/ML IV SOLN
INTRAVENOUS | Status: DC | PRN
  Administered 2013-12-21: 40 mg via INTRAVENOUS

## 2013-12-21 MED ORDER — OXYCODONE HCL 5 MG PO TABS
5.0000 mg | ORAL_TABLET | Freq: Once | ORAL | Status: AC
  Administered 2013-12-21: 5 mg via ORAL

## 2013-12-21 MED ORDER — FENTANYL CITRATE 0.05 MG/ML IJ SOLN
INTRAMUSCULAR | Status: DC | PRN
  Administered 2013-12-21 (×2): 50 ug via INTRAVENOUS

## 2013-12-21 MED ORDER — MIDAZOLAM HCL 2 MG/2ML IJ SOLN
INTRAMUSCULAR | Status: AC
  Administered 2013-12-21: 1 mg via INTRAVENOUS
  Filled 2013-12-21: qty 2

## 2013-12-21 MED ORDER — ONDANSETRON HCL 4 MG/2ML IJ SOLN
INTRAMUSCULAR | Status: AC
  Filled 2013-12-21: qty 2

## 2013-12-21 MED ORDER — MIDAZOLAM HCL 5 MG/5ML IJ SOLN
INTRAMUSCULAR | Status: DC | PRN
  Administered 2013-12-21: 1 mg via INTRAVENOUS

## 2013-12-21 MED ORDER — PROPOFOL 10 MG/ML IV BOLUS
INTRAVENOUS | Status: DC | PRN
  Administered 2013-12-21: 130 mg via INTRAVENOUS

## 2013-12-21 MED ORDER — MIDAZOLAM HCL 2 MG/2ML IJ SOLN
1.0000 mg | INTRAMUSCULAR | Status: DC | PRN
  Administered 2013-12-21: 1 mg via INTRAVENOUS

## 2013-12-21 MED ORDER — OXYCODONE HCL 5 MG PO TABS: 5.0000 mg | ORAL_TABLET | ORAL | Status: DC | PRN

## 2013-12-21 MED ORDER — FENTANYL CITRATE 0.05 MG/ML IJ SOLN
50.0000 ug | INTRAMUSCULAR | Status: DC | PRN
  Administered 2013-12-21: 50 ug via INTRAVENOUS

## 2013-12-21 MED ORDER — LACTATED RINGERS IV SOLN
INTRAVENOUS | Status: DC
  Administered 2013-12-21: 11:00:00 via INTRAVENOUS

## 2013-12-21 MED ORDER — ONDANSETRON HCL 4 MG/2ML IJ SOLN
INTRAMUSCULAR | Status: DC | PRN
  Administered 2013-12-21: 4 mg via INTRAVENOUS

## 2013-12-21 MED ORDER — BUPIVACAINE-EPINEPHRINE 0.5% -1:200000 IJ SOLN
INTRAMUSCULAR | Status: DC | PRN
  Administered 2013-12-21: 20 mL

## 2013-12-21 MED ORDER — 0.9 % SODIUM CHLORIDE (POUR BTL) OPTIME
TOPICAL | Status: DC | PRN
  Administered 2013-12-21: 1000 mL

## 2013-12-21 MED ORDER — LACTATED RINGERS IV SOLN
INTRAVENOUS | Status: DC | PRN
  Administered 2013-12-21: 11:00:00 via INTRAVENOUS

## 2013-12-21 MED ORDER — OXYCODONE HCL 5 MG PO TABS
5.0000 mg | ORAL_TABLET | ORAL | Status: DC | PRN
Start: 2013-12-21 — End: 2014-01-03

## 2013-12-21 MED ORDER — FENTANYL CITRATE 0.05 MG/ML IJ SOLN
INTRAMUSCULAR | Status: AC
  Filled 2013-12-21: qty 5

## 2013-12-21 MED ORDER — OXYCODONE HCL 5 MG PO TABS
ORAL_TABLET | ORAL | Status: AC
  Filled 2013-12-21: qty 1

## 2013-12-21 SURGICAL SUPPLY — 46 items
BENZOIN TINCTURE PRP APPL 2/3 (GAUZE/BANDAGES/DRESSINGS) ×3 IMPLANT
BLADE SURG 10 STRL SS (BLADE) ×3 IMPLANT
BLADE SURG 15 STRL LF DISP TIS (BLADE) ×1 IMPLANT
BLADE SURG 15 STRL SS (BLADE) ×2
BLADE SURG ROTATE 9660 (MISCELLANEOUS) ×3 IMPLANT
CHLORAPREP W/TINT 26ML (MISCELLANEOUS) ×3 IMPLANT
CLOSURE STERI-STRIP 1/2X4 (GAUZE/BANDAGES/DRESSINGS) ×1
CLOSURE WOUND 1/2 X4 (GAUZE/BANDAGES/DRESSINGS) ×1
CLSR STERI-STRIP ANTIMIC 1/2X4 (GAUZE/BANDAGES/DRESSINGS) ×2 IMPLANT
COVER SURGICAL LIGHT HANDLE (MISCELLANEOUS) ×3 IMPLANT
DRAIN PENROSE 3/4X12 (DRAIN) ×3 IMPLANT
DRAPE INCISE IOBAN 66X45 STRL (DRAPES) ×3 IMPLANT
DRAPE LAPAROTOMY TRNSV 102X78 (DRAPE) ×3 IMPLANT
DRAPE UTILITY 15X26 W/TAPE STR (DRAPE) ×6 IMPLANT
DRSG OPSITE 6X11 MED (GAUZE/BANDAGES/DRESSINGS) ×3 IMPLANT
DRSG TEGADERM 4X4.75 (GAUZE/BANDAGES/DRESSINGS) ×3 IMPLANT
ELECT CAUTERY BLADE 6.4 (BLADE) ×3 IMPLANT
ELECT REM PT RETURN 9FT ADLT (ELECTROSURGICAL) ×3
ELECTRODE REM PT RTRN 9FT ADLT (ELECTROSURGICAL) ×1 IMPLANT
GLOVE BIOGEL PI IND STRL 7.0 (GLOVE) ×1 IMPLANT
GLOVE BIOGEL PI IND STRL 8 (GLOVE) ×1 IMPLANT
GLOVE BIOGEL PI INDICATOR 7.0 (GLOVE) ×2
GLOVE BIOGEL PI INDICATOR 8 (GLOVE) ×2
GLOVE ECLIPSE 8.0 STRL XLNG CF (GLOVE) ×3 IMPLANT
GOWN STRL REUS W/ TWL LRG LVL3 (GOWN DISPOSABLE) ×2 IMPLANT
GOWN STRL REUS W/TWL LRG LVL3 (GOWN DISPOSABLE) ×4
KIT BASIN OR (CUSTOM PROCEDURE TRAY) ×3 IMPLANT
KIT ROOM TURNOVER OR (KITS) ×3 IMPLANT
MESH HERNIA 3X6 (Mesh General) ×3 IMPLANT
NEEDLE HYPO 25GX1X1/2 BEV (NEEDLE) ×3 IMPLANT
NS IRRIG 1000ML POUR BTL (IV SOLUTION) ×3 IMPLANT
PACK SURGICAL SETUP 50X90 (CUSTOM PROCEDURE TRAY) ×3 IMPLANT
PAD ARMBOARD 7.5X6 YLW CONV (MISCELLANEOUS) ×3 IMPLANT
PENCIL BUTTON HOLSTER BLD 10FT (ELECTRODE) ×3 IMPLANT
SPONGE GAUZE 4X4 12PLY (GAUZE/BANDAGES/DRESSINGS) ×3 IMPLANT
SPONGE LAP 18X18 X RAY DECT (DISPOSABLE) ×3 IMPLANT
STRIP CLOSURE SKIN 1/2X4 (GAUZE/BANDAGES/DRESSINGS) ×2 IMPLANT
SUT MON AB 4-0 PC3 18 (SUTURE) ×3 IMPLANT
SUT PROLENE 2 0 CT2 30 (SUTURE) ×6 IMPLANT
SUT VIC AB 2-0 SH 18 (SUTURE) ×3 IMPLANT
SUT VIC AB 3-0 SH 27 (SUTURE) ×4
SUT VIC AB 3-0 SH 27XBRD (SUTURE) ×2 IMPLANT
SUT VICRYL AB 3 0 TIES (SUTURE) ×3 IMPLANT
SYR CONTROL 10ML LL (SYRINGE) ×3 IMPLANT
TOWEL OR 17X24 6PK STRL BLUE (TOWEL DISPOSABLE) ×3 IMPLANT
TOWEL OR 17X26 10 PK STRL BLUE (TOWEL DISPOSABLE) ×3 IMPLANT

## 2013-12-21 NOTE — Anesthesia Postprocedure Evaluation (Signed)
  Anesthesia Post-op Note  Patient: Evelyn Collins  Procedure(s) Performed: Procedure(s): HERNIA REPAIR INGUINAL ADULT (Right) INSERTION OF MESH (Right)  Patient Location: PACU  Anesthesia Type:General and GA combined with regional for post-op pain  Level of Consciousness: awake, alert  and oriented  Airway and Oxygen Therapy: Patient Spontanous Breathing  Post-op Pain: none  Post-op Assessment: Post-op Vital signs reviewed, Patient's Cardiovascular Status Stable, Respiratory Function Stable, Patent Airway and Pain level controlled  Post-op Vital Signs: stable  Last Vitals:  Filed Vitals:   12/21/13 1315  BP:   Pulse: 66  Temp:   Resp: 20    Complications: No apparent anesthesia complications

## 2013-12-21 NOTE — Anesthesia Preprocedure Evaluation (Addendum)
Anesthesia Evaluation  Patient identified by MRN, date of birth, ID band Patient awake    Reviewed: Allergy & Precautions, H&P , NPO status , Patient's Chart, lab work & pertinent test results  Airway Mallampati: II TM Distance: >3 FB Neck ROM: Full    Dental  (+) Teeth Intact, Dental Advisory Given   Pulmonary  breath sounds clear to auscultation        Cardiovascular hypertension, Rhythm:Regular Rate:Normal     Neuro/Psych    GI/Hepatic   Endo/Other  diabetes  Renal/GU      Musculoskeletal   Abdominal   Peds  Hematology   Anesthesia Other Findings   Reproductive/Obstetrics                          Anesthesia Physical Anesthesia Plan  ASA: III  Anesthesia Plan: General   Post-op Pain Management:    Induction: Intravenous  Airway Management Planned: Oral ETT  Additional Equipment:   Intra-op Plan:   Post-operative Plan: Extubation in OR  Informed Consent: I have reviewed the patients History and Physical, chart, labs and discussed the procedure including the risks, benefits and alternatives for the proposed anesthesia with the patient or authorized representative who has indicated his/her understanding and acceptance.   Dental advisory given  Plan Discussed with: CRNA and Anesthesiologist  Anesthesia Plan Comments: (R. Inguinal hernia Type 2 DM glucose  Hypertension S/P AVR 09/27/2004  Plan GA with oral ETT and TAP block  Roberts Gaudy)        Anesthesia Quick Evaluation

## 2013-12-21 NOTE — H&P (Signed)
Evelyn [place] is an 80 y.o. female.   Chief Complaint: Here for elective surgery HPI:   About 8 months ago, she noticed a small right groin bulge that was mildly uncomfortable. The bulge has persisted, becoming slightly larger, and the discomfort remains. The bulge goes away in the supine position. CT scan by report demonstrates a right inguinal hernia (RIH) containing fat. She has chronic constipation and has to strain to have a bowel movement. She thinks this is the cause of the hernia.  Constipation has improved.   Past Medical History  Diagnosis Date  . H/O aortic valve replacement   . Inguinal hernia   . Hyperlipidemia   . Diabetes mellitus without complication   . Glaucoma   . Hypertension   . Allergy to pollen   . Pacemaker   . Coronary artery disease   . Asthma   . Constipation   . Arthritis   . Stroke 2010ish    no residual  . Glaucoma     Past Surgical History  Procedure Laterality Date  . Hernia repair      umbilical hernia x2  . Tonsillectomy    . Exploration post operative open heart    . Coronary artery bypass graft  2006  . Aortic valve replacement  2006  . Eye surgery Bilateral     cataract    History reviewed. No pertinent family history. Social History:  reports that she has never smoked. She has never used smokeless tobacco. She reports that she does not drink alcohol or use illicit drugs.  Allergies:  Allergies  Allergen Reactions  . Other     Eye drop for glaucoma cannot remember name - gave her palpitations    Medications Prior to Admission  Medication Sig Dispense Refill  . albuterol (PROVENTIL HFA;VENTOLIN HFA) 108 (90 BASE) MCG/ACT inhaler Inhale 2 puffs into the lungs every 4 (four) hours as needed for shortness of breath (cough).      Marland Kitchen alendronate (FOSAMAX) 70 MG tablet Take 70 mg by mouth every Monday. Take with a full glass of water on an empty stomach.      Marland Kitchen aspirin EC 81 MG tablet Take 162 mg by mouth every morning.       .  budesonide-formoterol (SYMBICORT) 160-4.5 MCG/ACT inhaler Inhale 2 puffs into the lungs 2 (two) times daily.      . Calcium Citrate-Vitamin D (CALCIUM CITRATE + D PO) Take 1-2 tablets by mouth See admin instructions. Day 1 - 1 tablets once daily, day 2 - 1 tablet twice daily, repeat      . cholecalciferol (VITAMIN D) 1000 UNITS tablet Take 1,000 Units by mouth at bedtime. Two tablets      . Cholecalciferol (VITAMIN D) 2000 UNITS CAPS Take 2,000 Units by mouth every morning.      . docusate sodium (COLACE) 100 MG capsule Take 100 mg by mouth 2 (two) times daily.      . dorzolamide-timolol (COSOPT) 22.3-6.8 MG/ML ophthalmic solution Place 1 drop into both eyes 2 (two) times daily.      . Glucos-Chondroit-Hyaluron-MSM (GLUCOSAMINE CHONDROITIN JOINT PO) Take 1 tablet by mouth 2 (two) times daily.       Marland Kitchen latanoprost (XALATAN) 0.005 % ophthalmic solution Place 1 drop into both eyes at bedtime.      Marland Kitchen loratadine (CLARITIN) 10 MG tablet Take 10 mg by mouth daily.      Marland Kitchen losartan (COZAAR) 25 MG tablet Take 25 mg by mouth every morning.       Marland Kitchen  metFORMIN (GLUCOPHAGE) 1000 MG tablet Take 1,000 mg by mouth 2 (two) times daily with a meal.      . Multiple Vitamin (MULTIVITAMIN WITH MINERALS) TABS tablet Take 1 tablet by mouth every other day.      . Omega-3 Fatty Acids (FISH OIL) 1200 MG CAPS Take 1,200 mg by mouth every morning.      . rosuvastatin (CRESTOR) 20 MG tablet Take 10-20 mg by mouth every evening. Alternates taking 0.5 tablet and 1 tablet daily      . ACCU-CHEK FASTCLIX LANCETS MISC by Does not apply route.        No results found for this or any previous visit (from the past 48 hour(s)). No results found.  Review of Systems  Constitutional: Negative for fever and chills.  Gastrointestinal: Negative for nausea and vomiting.    Blood pressure 106/57, pulse 69, resp. rate 16, height 5\' 7"  (1.702 m), weight 166 lb (75.297 kg), SpO2 97.00%. Physical Exam  Constitutional: She appears  well-developed and well-nourished. No distress.  HENT:  Head: Normocephalic and atraumatic.  Cardiovascular: Normal rate and regular rhythm.   Murmur heard. Respiratory: Effort normal and breath sounds normal.  GI: Soft. There is no tenderness.  Genitourinary:  Right inguinal bulge reducible in the supine position.  Musculoskeletal:  SCDs on  Neurological: She is alert.  Skin: Skin is warm and dry.     Assessment/Plan Symptomatic RIH  Plan:  RIH repair with mesh.  Rhunette Croft Tadan Shill 12/21/2013, 10:53 AM

## 2013-12-21 NOTE — Progress Notes (Signed)
Lunch relief by E. Compton Therapist, sports

## 2013-12-21 NOTE — Interval H&P Note (Signed)
History and Physical Interval Note:  12/21/2013 11:03 AM  Evelyn [place]  has presented today for surgery, with the diagnosis of right inguinal hernia   The various methods of treatment have been discussed with the patient and family. After consideration of risks, benefits and other options for treatment, the patient has consented to  Procedure(s): HERNIA REPAIR INGUINAL ADULT (Right) INSERTION OF MESH (Right) as a surgical intervention .  The patient's history has been reviewed, patient examined, no change in status, stable for surgery.  I have reviewed the patient's chart and labs.  Questions were answered to the patient's satisfaction.     Rhunette Croft Cru Kritikos

## 2013-12-21 NOTE — Transfer of Care (Signed)
Immediate Anesthesia Transfer of Care Note  Patient: Evelyn [place]  Procedure(s) Performed: Procedure(s): HERNIA REPAIR INGUINAL ADULT (Right) INSERTION OF MESH (Right)  Patient Location: PACU  Anesthesia Type:General  Level of Consciousness: awake, alert  and oriented  Airway & Oxygen Therapy: Patient Spontanous Breathing  Post-op Assessment: Report given to PACU RN  Post vital signs: Reviewed and stable  Complications: No apparent anesthesia complications

## 2013-12-21 NOTE — Discharge Instructions (Signed)
[facility] _______[facility]  INGUINAL HERNIA REPAIR: POST OP INSTRUCTIONS  Always review your discharge instruction sheet given to you by the facility where your surgery was performed. IF YOU HAVE DISABILITY OR FAMILY LEAVE FORMS, YOU MUST BRING THEM TO THE OFFICE FOR PROCESSING.   DO NOT GIVE THEM TO YOUR DOCTOR.  1. A  prescription for pain medication may be given to you upon discharge.  Take your pain medication as prescribed, if needed.  If narcotic pain medicine is not needed, then you may take acetaminophen (Tylenol) or ibuprofen (Advil) as needed. 2. Take your usually prescribed medications unless otherwise directed. 3. If you need a refill on your pain medication, please contact your pharmacy.  They will contact our office to request authorization. Prescriptions will not be filled after 5 pm or on week-ends. 4. You should follow a light diet the first 24 hours after arrival home, such as soup and crackers, etc.  Be sure to include lots of fluids daily.  Resume your normal diet the day after surgery. 5. Most patients will experience some swelling and bruising around the groin.  Ice packs and reclining will help.  Swelling and bruising can take several days to resolve.  6. It is common to experience some constipation if taking pain medication after surgery.  Increasing fluid intake and taking a stool softener (such as Colace) will usually help or prevent this problem from occurring.  A mild laxative (Milk of Magnesia or Miralax) should be taken according to package directions if there are no bowel movements after 48 hours. 7. Unless discharge instructions indicate otherwise, you may remove your bandages 7 hours after surgery, and you may shower at that time.  You may have steri-strips (small skin tapes) in place directly over the incision.  These strips should be left on the skin.  If your surgeon used skin glue on the incision, you may shower in 24 hours.  The glue will flake off over  the next 2-3 weeks.  Any sutures or staples will be removed at the office during your follow-up visit. 8. ACTIVITIES:  You may resume regular (light) daily activities beginning the next day--such as daily self-care, walking, climbing stairs--gradually increasing activities as tolerated.  You may have sexual intercourse when it is comfortable.  Refrain from any heavy lifting or straining-nothing over 10 pounds for 6 weeks.  a. You may drive when you are no longer taking prescription pain medication, you can comfortably wear a seatbelt, and you can safely maneuver your car and apply brakes. b. RETURN TO WORK:  __________________________________________________________ 9. You should see your doctor in the office for a follow-up appointment approximately 2-3 weeks after your surgery.  Make sure that you call for this appointment within a day or two after you arrive home to insure a convenient appointment time. 10. OTHER INSTRUCTIONS:  __________________________________________________________________________________________________________________________________________________________________________________________  WHEN TO CALL YOUR DOCTOR: 1. Fever over 101.0 2. Inability to urinate 3. Nausea and/or vomiting 4. Extreme swelling or bruising 5. Continued bleeding from incision. 6. Increased pain, redness, or drainage from the incision  The clinic staff is available to answer your questions during regular business hours.  Please dont hesitate to call and ask to speak to one of the nurses for clinical concerns.  If you have a medical emergency, go to the nearest emergency room or call 911.  A surgeon from [facility] is always on call at the hospital   [address], [place], [place], [place]  [address] ?  [address], [place], [place]   [id number] [phone] ? [phone] ? FAX [phone] Web site: [email or web address]

## 2013-12-21 NOTE — Anesthesia Procedure Notes (Signed)
Anesthesia Regional Block:  TAP block  Pre-Anesthetic Checklist: ,, timeout performed, Correct Patient, Correct Site, Correct Laterality, Correct Procedure, Correct Position, site marked, Risks and benefits discussed,  Surgical consent,  Pre-op evaluation,  At surgeon's request and post-op pain management  Laterality: Right  Prep: chloraprep       Needles:  Injection technique: Single-shot  Needle Type: Echogenic Stimulator Needle     Needle Length: 9cm 9 cm Needle Gauge: 22 and 22 G    Additional Needles:  Procedures: ultrasound guided (picture in chart) TAP block Narrative:  Start time: 12/21/2013 10:25 AM End time: 12/21/2013 10:30 AM Injection made incrementally with aspirations every 5 mL.  Performed by: Personally   Additional Notes: 25 cc 0.5% marcaine with 1:200 Epi injected easily

## 2013-12-21 NOTE — Op Note (Signed)
Preoperative diagnosis:  Right inguinal hernia.  Postoperative diagnosis:  Same  Procedure:  Right inguinal hernia repair with mesh.  Surgeon:  Jackolyn Confer, M.D.  Anesthesia:  General/LMA with local (Marcaine).  Indication:  This is a 80 year old female with a symptomatic right inguinal hernia.  She now presents for repair.  Technique:  She was seen in the holding room and the right groin was marked with my initials. She was brought to the operating, placed supine on the operating table, and the anesthetic was administered by the anesthesiologist. The hair in the groin area was clipped as was felt to be necessary. This area was then sterilely prepped and draped.  Local anesthetic was infiltrated in the superficial and deep tissues in the right groin.  An incision was made through the skin and subcutaneous tissue until the external oblique aponeurosis was identified.  Local anesthetic was infiltrated deep to the external oblique aponeurosis. The external oblique aponeurosis was divided through the external ring medially and back toward the anterior superior iliac spine laterally. Using blunt dissection, the shelving edge of the inguinal ligament was identified inferiorly and the internal oblique aponeurosis and muscle were identified superiorly. The ilioinguinal nerve was identified and preserved.  The round ligament was isolated and a posterior window was made around it. The indirect hernia sac and extraperitoneal fat extruding through the patulous internal ring were identified and separated from the round ligament using blunt dissection. The hernia sac and its contents were reduced through the indirect hernia defect.   A piece of 3" x 6" polypropylene mesh was brought into the field and anchored 1-2 cm medial to the pubic tubercle with 2-0 Prolene suture. The inferior aspect of the mesh was anchored to the shelving edge of the inguinal ligament with running 2-0 Prolene suture to a level 1-2 cm  lateral to the internal ring. A slit was cut in the mesh creating 2 tails. These were wrapped around the round ligament. The superior aspect of the mesh was anchored to the internal oblique aponeurosis and muscle with interrupted 2-0 Vicryl sutures. The 2 tails of the mesh were then crossed creating a new internal ring and were anchored to the shelving edge of the inguinal ligament with 2-0 Prolene suture. The tip of a hemostat could be placed through the new aperture. The lateral aspect of the mesh was then tucked deep to the external oblique aponeurosis.  The wound was inspected and hemostasis was adequate. The external oblique aponeurosis was then closed over the mesh and cord with running 3-0 Vicryl suture. The subcutaneous tissue was closed with running 3-0 Vicryl suture. The skin closed with a running 4-0 Monocryl subcuticular stitch.  Steri-Strips and a sterile dressing were applied.  The procedure was well-tolerated without any apparent complications and she was taken to the recovery room in satisfactory condition.

## 2013-12-22 ENCOUNTER — Encounter (HOSPITAL_COMMUNITY): Payer: Self-pay | Admitting: General Surgery

## 2013-12-22 ENCOUNTER — Telehealth (INDEPENDENT_AMBULATORY_CARE_PROVIDER_SITE_OTHER): Payer: Self-pay

## 2013-12-22 NOTE — Telephone Encounter (Signed)
Pt post op hernia repair yesterday by Dr. Zella Richer.  She called to schedule her post op appt.  She reports considerable swelling.  I told her this was normal for after surgery and to take her pain medication as directed along with Ibuprofen and applying ice to the area.  The swelling should slowly resolve.  Pt told to call if any more concerns or questions.

## 2013-12-26 NOTE — Telephone Encounter (Signed)
Patient called in to explain that she is still having swelling and she wanted to know about how long this could occur.  Informed her that this varies for each person but that it usually will last the first week to two weeks and that she should continue with ice packs and ibuprofen to help reduce this.  She explained she has been and that it has come down a little.

## 2014-01-03 ENCOUNTER — Ambulatory Visit (INDEPENDENT_AMBULATORY_CARE_PROVIDER_SITE_OTHER): Payer: Medicare Other | Admitting: General Surgery

## 2014-01-03 ENCOUNTER — Encounter (INDEPENDENT_AMBULATORY_CARE_PROVIDER_SITE_OTHER): Payer: Self-pay | Admitting: General Surgery

## 2014-01-03 VITALS — BP 118/60 | HR 66 | Temp 98.1°F | Resp 14 | Ht 67.0 in | Wt 163.4 lb

## 2014-01-03 DIAGNOSIS — Z4889 Encounter for other specified surgical aftercare: Secondary | ICD-10-CM

## 2014-01-03 NOTE — Patient Instructions (Signed)
6 weeks after the surgery, may resume normal activities as tolerated, as discussed. 

## 2014-01-03 NOTE — Progress Notes (Signed)
She presents for postop followup after open right inguinal hernia repair with mesh.  She had minimal postoperative pain. Her bowels are moving. Swelling is improving.  P.E.  GU:  Right groin incision clean/dry/intact, swelling is minimal, repair is solid.  Assessment:  Doing well post hernia repair.  Plan:  Continue light activities for 6 weeks postop then slowly start to resume normal activities. Return prn.

## 2014-01-09 DIAGNOSIS — E119 Type 2 diabetes mellitus without complications: Secondary | ICD-10-CM | POA: Diagnosis not present

## 2014-01-09 DIAGNOSIS — M810 Age-related osteoporosis without current pathological fracture: Secondary | ICD-10-CM | POA: Diagnosis not present

## 2014-01-09 DIAGNOSIS — I2510 Atherosclerotic heart disease of native coronary artery without angina pectoris: Secondary | ICD-10-CM | POA: Diagnosis not present

## 2014-01-09 DIAGNOSIS — R809 Proteinuria, unspecified: Secondary | ICD-10-CM | POA: Diagnosis not present

## 2014-01-09 DIAGNOSIS — R82998 Other abnormal findings in urine: Secondary | ICD-10-CM | POA: Diagnosis not present

## 2014-01-15 DIAGNOSIS — E785 Hyperlipidemia, unspecified: Secondary | ICD-10-CM | POA: Diagnosis not present

## 2014-01-15 DIAGNOSIS — I2510 Atherosclerotic heart disease of native coronary artery without angina pectoris: Secondary | ICD-10-CM | POA: Diagnosis not present

## 2014-01-15 DIAGNOSIS — I359 Nonrheumatic aortic valve disorder, unspecified: Secondary | ICD-10-CM | POA: Diagnosis not present

## 2014-01-15 DIAGNOSIS — Z01810 Encounter for preprocedural cardiovascular examination: Secondary | ICD-10-CM | POA: Diagnosis not present

## 2014-01-15 DIAGNOSIS — Z950 Presence of cardiac pacemaker: Secondary | ICD-10-CM | POA: Diagnosis not present

## 2014-01-15 DIAGNOSIS — Z954 Presence of other heart-valve replacement: Secondary | ICD-10-CM | POA: Diagnosis not present

## 2014-01-15 DIAGNOSIS — E119 Type 2 diabetes mellitus without complications: Secondary | ICD-10-CM | POA: Diagnosis not present

## 2014-01-15 DIAGNOSIS — I4891 Unspecified atrial fibrillation: Secondary | ICD-10-CM | POA: Diagnosis not present

## 2014-01-16 ENCOUNTER — Other Ambulatory Visit: Payer: Self-pay | Admitting: Internal Medicine

## 2014-01-16 DIAGNOSIS — Z1331 Encounter for screening for depression: Secondary | ICD-10-CM | POA: Diagnosis not present

## 2014-01-16 DIAGNOSIS — Z0000 Encounter for general adult medical examination without abnormal findings: Secondary | ICD-10-CM | POA: Diagnosis not present

## 2014-01-16 DIAGNOSIS — H409 Unspecified glaucoma: Secondary | ICD-10-CM | POA: Diagnosis not present

## 2014-01-16 DIAGNOSIS — E785 Hyperlipidemia, unspecified: Secondary | ICD-10-CM | POA: Diagnosis not present

## 2014-01-16 DIAGNOSIS — Z1231 Encounter for screening mammogram for malignant neoplasm of breast: Secondary | ICD-10-CM

## 2014-01-16 DIAGNOSIS — E119 Type 2 diabetes mellitus without complications: Secondary | ICD-10-CM | POA: Diagnosis not present

## 2014-01-16 DIAGNOSIS — I10 Essential (primary) hypertension: Secondary | ICD-10-CM | POA: Diagnosis not present

## 2014-01-16 DIAGNOSIS — M810 Age-related osteoporosis without current pathological fracture: Secondary | ICD-10-CM | POA: Diagnosis not present

## 2014-01-16 DIAGNOSIS — Z23 Encounter for immunization: Secondary | ICD-10-CM | POA: Diagnosis not present

## 2014-01-26 ENCOUNTER — Ambulatory Visit
Admission: RE | Admit: 2014-01-26 | Discharge: 2014-01-26 | Disposition: A | Discharge status: Home / Self Care | Payer: Medicare Other | Source: Ambulatory Visit | Attending: Internal Medicine | Admitting: Internal Medicine

## 2014-01-26 DIAGNOSIS — Z1231 Encounter for screening mammogram for malignant neoplasm of breast: Secondary | ICD-10-CM | POA: Diagnosis not present

## 2014-01-29 ENCOUNTER — Other Ambulatory Visit: Payer: Self-pay | Admitting: Internal Medicine

## 2014-01-29 DIAGNOSIS — R928 Other abnormal and inconclusive findings on diagnostic imaging of breast: Secondary | ICD-10-CM

## 2014-02-06 DIAGNOSIS — I10 Essential (primary) hypertension: Secondary | ICD-10-CM | POA: Diagnosis not present

## 2014-02-07 ENCOUNTER — Ambulatory Visit
Admission: RE | Admit: 2014-02-07 | Discharge: 2014-02-07 | Disposition: A | Discharge status: Home / Self Care | Payer: Medicare Other | Source: Ambulatory Visit | Attending: Internal Medicine | Admitting: Internal Medicine

## 2014-02-07 DIAGNOSIS — R928 Other abnormal and inconclusive findings on diagnostic imaging of breast: Secondary | ICD-10-CM

## 2014-02-09 DIAGNOSIS — I10 Essential (primary) hypertension: Secondary | ICD-10-CM | POA: Diagnosis not present

## 2014-02-21 DIAGNOSIS — Z954 Presence of other heart-valve replacement: Secondary | ICD-10-CM | POA: Diagnosis not present

## 2014-02-21 DIAGNOSIS — I359 Nonrheumatic aortic valve disorder, unspecified: Secondary | ICD-10-CM | POA: Diagnosis not present

## 2014-02-21 DIAGNOSIS — Z950 Presence of cardiac pacemaker: Secondary | ICD-10-CM | POA: Diagnosis not present

## 2014-02-21 DIAGNOSIS — I2510 Atherosclerotic heart disease of native coronary artery without angina pectoris: Secondary | ICD-10-CM | POA: Diagnosis not present

## 2014-02-21 DIAGNOSIS — E119 Type 2 diabetes mellitus without complications: Secondary | ICD-10-CM | POA: Diagnosis not present

## 2014-02-21 DIAGNOSIS — E785 Hyperlipidemia, unspecified: Secondary | ICD-10-CM | POA: Diagnosis not present

## 2014-02-21 DIAGNOSIS — I4891 Unspecified atrial fibrillation: Secondary | ICD-10-CM | POA: Diagnosis not present

## 2014-04-16 DIAGNOSIS — Z1331 Encounter for screening for depression: Secondary | ICD-10-CM | POA: Diagnosis not present

## 2014-04-16 DIAGNOSIS — M25569 Pain in unspecified knee: Secondary | ICD-10-CM | POA: Diagnosis not present

## 2014-04-16 DIAGNOSIS — I4891 Unspecified atrial fibrillation: Secondary | ICD-10-CM | POA: Diagnosis not present

## 2014-04-16 DIAGNOSIS — E785 Hyperlipidemia, unspecified: Secondary | ICD-10-CM | POA: Diagnosis not present

## 2014-04-16 DIAGNOSIS — E1169 Type 2 diabetes mellitus with other specified complication: Secondary | ICD-10-CM | POA: Diagnosis not present

## 2014-04-16 DIAGNOSIS — Z6828 Body mass index (BMI) 28.0-28.9, adult: Secondary | ICD-10-CM | POA: Diagnosis not present

## 2014-04-16 DIAGNOSIS — R059 Cough, unspecified: Secondary | ICD-10-CM | POA: Diagnosis not present

## 2014-04-16 DIAGNOSIS — R05 Cough: Secondary | ICD-10-CM | POA: Diagnosis not present

## 2014-04-16 DIAGNOSIS — I10 Essential (primary) hypertension: Secondary | ICD-10-CM | POA: Diagnosis not present

## 2014-04-17 DIAGNOSIS — M25569 Pain in unspecified knee: Secondary | ICD-10-CM | POA: Diagnosis not present

## 2014-05-01 DIAGNOSIS — M25569 Pain in unspecified knee: Secondary | ICD-10-CM | POA: Diagnosis not present

## 2014-05-03 DIAGNOSIS — M25569 Pain in unspecified knee: Secondary | ICD-10-CM | POA: Diagnosis not present

## 2014-05-03 DIAGNOSIS — M810 Age-related osteoporosis without current pathological fracture: Secondary | ICD-10-CM | POA: Diagnosis not present

## 2014-05-03 DIAGNOSIS — M6281 Muscle weakness (generalized): Secondary | ICD-10-CM | POA: Diagnosis not present

## 2014-05-08 DIAGNOSIS — M810 Age-related osteoporosis without current pathological fracture: Secondary | ICD-10-CM | POA: Diagnosis not present

## 2014-05-08 DIAGNOSIS — M6281 Muscle weakness (generalized): Secondary | ICD-10-CM | POA: Diagnosis not present

## 2014-05-08 DIAGNOSIS — M25569 Pain in unspecified knee: Secondary | ICD-10-CM | POA: Diagnosis not present

## 2014-05-10 DIAGNOSIS — M810 Age-related osteoporosis without current pathological fracture: Secondary | ICD-10-CM | POA: Diagnosis not present

## 2014-05-10 DIAGNOSIS — M25569 Pain in unspecified knee: Secondary | ICD-10-CM | POA: Diagnosis not present

## 2014-05-10 DIAGNOSIS — M6281 Muscle weakness (generalized): Secondary | ICD-10-CM | POA: Diagnosis not present

## 2014-05-16 DIAGNOSIS — M6281 Muscle weakness (generalized): Secondary | ICD-10-CM | POA: Diagnosis not present

## 2014-05-16 DIAGNOSIS — M25569 Pain in unspecified knee: Secondary | ICD-10-CM | POA: Diagnosis not present

## 2014-05-16 DIAGNOSIS — M810 Age-related osteoporosis without current pathological fracture: Secondary | ICD-10-CM | POA: Diagnosis not present

## 2014-05-18 DIAGNOSIS — M810 Age-related osteoporosis without current pathological fracture: Secondary | ICD-10-CM | POA: Diagnosis not present

## 2014-05-18 DIAGNOSIS — M25569 Pain in unspecified knee: Secondary | ICD-10-CM | POA: Diagnosis not present

## 2014-05-18 DIAGNOSIS — M6281 Muscle weakness (generalized): Secondary | ICD-10-CM | POA: Diagnosis not present

## 2014-05-18 DIAGNOSIS — Z23 Encounter for immunization: Secondary | ICD-10-CM | POA: Diagnosis not present

## 2014-05-21 DIAGNOSIS — M810 Age-related osteoporosis without current pathological fracture: Secondary | ICD-10-CM | POA: Diagnosis not present

## 2014-05-21 DIAGNOSIS — M6281 Muscle weakness (generalized): Secondary | ICD-10-CM | POA: Diagnosis not present

## 2014-05-21 DIAGNOSIS — M25569 Pain in unspecified knee: Secondary | ICD-10-CM | POA: Diagnosis not present

## 2014-05-23 DIAGNOSIS — M810 Age-related osteoporosis without current pathological fracture: Secondary | ICD-10-CM | POA: Diagnosis not present

## 2014-05-23 DIAGNOSIS — M25569 Pain in unspecified knee: Secondary | ICD-10-CM | POA: Diagnosis not present

## 2014-05-23 DIAGNOSIS — M6281 Muscle weakness (generalized): Secondary | ICD-10-CM | POA: Diagnosis not present

## 2014-05-28 DIAGNOSIS — Z961 Presence of intraocular lens: Secondary | ICD-10-CM | POA: Diagnosis not present

## 2014-05-28 DIAGNOSIS — E119 Type 2 diabetes mellitus without complications: Secondary | ICD-10-CM | POA: Diagnosis not present

## 2014-05-28 DIAGNOSIS — H4011X1 Primary open-angle glaucoma, mild stage: Secondary | ICD-10-CM | POA: Diagnosis not present

## 2014-06-05 DIAGNOSIS — M25569 Pain in unspecified knee: Secondary | ICD-10-CM | POA: Diagnosis not present

## 2014-06-05 DIAGNOSIS — M6281 Muscle weakness (generalized): Secondary | ICD-10-CM | POA: Diagnosis not present

## 2014-06-05 DIAGNOSIS — M810 Age-related osteoporosis without current pathological fracture: Secondary | ICD-10-CM | POA: Diagnosis not present

## 2014-06-07 DIAGNOSIS — Z1212 Encounter for screening for malignant neoplasm of rectum: Secondary | ICD-10-CM | POA: Diagnosis not present

## 2014-06-08 DIAGNOSIS — M25569 Pain in unspecified knee: Secondary | ICD-10-CM | POA: Diagnosis not present

## 2014-06-08 DIAGNOSIS — M810 Age-related osteoporosis without current pathological fracture: Secondary | ICD-10-CM | POA: Diagnosis not present

## 2014-06-08 DIAGNOSIS — M6281 Muscle weakness (generalized): Secondary | ICD-10-CM | POA: Diagnosis not present

## 2014-06-12 DIAGNOSIS — M25569 Pain in unspecified knee: Secondary | ICD-10-CM | POA: Diagnosis not present

## 2014-06-12 DIAGNOSIS — M810 Age-related osteoporosis without current pathological fracture: Secondary | ICD-10-CM | POA: Diagnosis not present

## 2014-06-12 DIAGNOSIS — M6281 Muscle weakness (generalized): Secondary | ICD-10-CM | POA: Diagnosis not present

## 2014-06-15 DIAGNOSIS — M6281 Muscle weakness (generalized): Secondary | ICD-10-CM | POA: Diagnosis not present

## 2014-06-15 DIAGNOSIS — M810 Age-related osteoporosis without current pathological fracture: Secondary | ICD-10-CM | POA: Diagnosis not present

## 2014-06-15 DIAGNOSIS — M25569 Pain in unspecified knee: Secondary | ICD-10-CM | POA: Diagnosis not present

## 2014-06-19 DIAGNOSIS — M810 Age-related osteoporosis without current pathological fracture: Secondary | ICD-10-CM | POA: Diagnosis not present

## 2014-06-19 DIAGNOSIS — M25569 Pain in unspecified knee: Secondary | ICD-10-CM | POA: Diagnosis not present

## 2014-06-19 DIAGNOSIS — M6281 Muscle weakness (generalized): Secondary | ICD-10-CM | POA: Diagnosis not present

## 2014-06-21 DIAGNOSIS — M25569 Pain in unspecified knee: Secondary | ICD-10-CM | POA: Diagnosis not present

## 2014-06-21 DIAGNOSIS — M810 Age-related osteoporosis without current pathological fracture: Secondary | ICD-10-CM | POA: Diagnosis not present

## 2014-06-21 DIAGNOSIS — M6281 Muscle weakness (generalized): Secondary | ICD-10-CM | POA: Diagnosis not present

## 2014-06-26 DIAGNOSIS — M25569 Pain in unspecified knee: Secondary | ICD-10-CM | POA: Diagnosis not present

## 2014-06-26 DIAGNOSIS — M6281 Muscle weakness (generalized): Secondary | ICD-10-CM | POA: Diagnosis not present

## 2014-06-26 DIAGNOSIS — M810 Age-related osteoporosis without current pathological fracture: Secondary | ICD-10-CM | POA: Diagnosis not present

## 2014-07-17 DIAGNOSIS — M25569 Pain in unspecified knee: Secondary | ICD-10-CM | POA: Diagnosis not present

## 2014-07-17 DIAGNOSIS — M810 Age-related osteoporosis without current pathological fracture: Secondary | ICD-10-CM | POA: Diagnosis not present

## 2014-07-17 DIAGNOSIS — M6281 Muscle weakness (generalized): Secondary | ICD-10-CM | POA: Diagnosis not present

## 2014-07-19 DIAGNOSIS — M25562 Pain in left knee: Secondary | ICD-10-CM | POA: Diagnosis not present

## 2014-07-19 DIAGNOSIS — R32 Unspecified urinary incontinence: Secondary | ICD-10-CM | POA: Diagnosis not present

## 2014-07-19 DIAGNOSIS — E785 Hyperlipidemia, unspecified: Secondary | ICD-10-CM | POA: Diagnosis not present

## 2014-07-19 DIAGNOSIS — I10 Essential (primary) hypertension: Secondary | ICD-10-CM | POA: Diagnosis not present

## 2014-07-19 DIAGNOSIS — Z6827 Body mass index (BMI) 27.0-27.9, adult: Secondary | ICD-10-CM | POA: Diagnosis not present

## 2014-07-19 DIAGNOSIS — E119 Type 2 diabetes mellitus without complications: Secondary | ICD-10-CM | POA: Diagnosis not present

## 2014-07-19 DIAGNOSIS — Z79899 Other long term (current) drug therapy: Secondary | ICD-10-CM | POA: Diagnosis not present

## 2014-07-30 DIAGNOSIS — Z952 Presence of prosthetic heart valve: Secondary | ICD-10-CM | POA: Diagnosis not present

## 2014-07-30 DIAGNOSIS — I482 Chronic atrial fibrillation: Secondary | ICD-10-CM | POA: Diagnosis not present

## 2014-07-30 DIAGNOSIS — Z950 Presence of cardiac pacemaker: Secondary | ICD-10-CM | POA: Diagnosis not present

## 2014-07-30 DIAGNOSIS — I2510 Atherosclerotic heart disease of native coronary artery without angina pectoris: Secondary | ICD-10-CM | POA: Diagnosis not present

## 2014-07-30 DIAGNOSIS — E119 Type 2 diabetes mellitus without complications: Secondary | ICD-10-CM | POA: Diagnosis not present

## 2014-07-30 DIAGNOSIS — E785 Hyperlipidemia, unspecified: Secondary | ICD-10-CM | POA: Diagnosis not present

## 2014-07-30 DIAGNOSIS — I350 Nonrheumatic aortic (valve) stenosis: Secondary | ICD-10-CM | POA: Diagnosis not present

## 2014-08-01 DIAGNOSIS — Z952 Presence of prosthetic heart valve: Secondary | ICD-10-CM | POA: Diagnosis not present

## 2014-10-19 DIAGNOSIS — Z6827 Body mass index (BMI) 27.0-27.9, adult: Secondary | ICD-10-CM | POA: Diagnosis not present

## 2014-10-19 DIAGNOSIS — E785 Hyperlipidemia, unspecified: Secondary | ICD-10-CM | POA: Diagnosis not present

## 2014-10-19 DIAGNOSIS — E119 Type 2 diabetes mellitus without complications: Secondary | ICD-10-CM | POA: Diagnosis not present

## 2014-10-19 DIAGNOSIS — I10 Essential (primary) hypertension: Secondary | ICD-10-CM | POA: Diagnosis not present

## 2014-11-02 DIAGNOSIS — I350 Nonrheumatic aortic (valve) stenosis: Secondary | ICD-10-CM | POA: Diagnosis not present

## 2014-11-02 DIAGNOSIS — I482 Chronic atrial fibrillation: Secondary | ICD-10-CM | POA: Diagnosis not present

## 2014-11-02 DIAGNOSIS — Z952 Presence of prosthetic heart valve: Secondary | ICD-10-CM | POA: Diagnosis not present

## 2014-11-02 DIAGNOSIS — Z950 Presence of cardiac pacemaker: Secondary | ICD-10-CM | POA: Diagnosis not present

## 2014-11-02 DIAGNOSIS — E119 Type 2 diabetes mellitus without complications: Secondary | ICD-10-CM | POA: Diagnosis not present

## 2014-11-02 DIAGNOSIS — I2510 Atherosclerotic heart disease of native coronary artery without angina pectoris: Secondary | ICD-10-CM | POA: Diagnosis not present

## 2014-11-02 DIAGNOSIS — E785 Hyperlipidemia, unspecified: Secondary | ICD-10-CM | POA: Diagnosis not present

## 2014-11-05 DIAGNOSIS — R278 Other lack of coordination: Secondary | ICD-10-CM | POA: Diagnosis not present

## 2014-11-05 DIAGNOSIS — N393 Stress incontinence (female) (male): Secondary | ICD-10-CM | POA: Diagnosis not present

## 2014-11-05 DIAGNOSIS — N3941 Urge incontinence: Secondary | ICD-10-CM | POA: Diagnosis not present

## 2014-11-05 DIAGNOSIS — M6281 Muscle weakness (generalized): Secondary | ICD-10-CM | POA: Diagnosis not present

## 2014-11-12 DIAGNOSIS — N3941 Urge incontinence: Secondary | ICD-10-CM | POA: Diagnosis not present

## 2014-11-12 DIAGNOSIS — M6281 Muscle weakness (generalized): Secondary | ICD-10-CM | POA: Diagnosis not present

## 2014-11-12 DIAGNOSIS — R278 Other lack of coordination: Secondary | ICD-10-CM | POA: Diagnosis not present

## 2014-11-12 DIAGNOSIS — N393 Stress incontinence (female) (male): Secondary | ICD-10-CM | POA: Diagnosis not present

## 2014-11-20 DIAGNOSIS — N393 Stress incontinence (female) (male): Secondary | ICD-10-CM | POA: Diagnosis not present

## 2014-11-20 DIAGNOSIS — M6281 Muscle weakness (generalized): Secondary | ICD-10-CM | POA: Diagnosis not present

## 2014-11-20 DIAGNOSIS — R278 Other lack of coordination: Secondary | ICD-10-CM | POA: Diagnosis not present

## 2014-11-20 DIAGNOSIS — N3941 Urge incontinence: Secondary | ICD-10-CM | POA: Diagnosis not present

## 2014-11-27 DIAGNOSIS — M6281 Muscle weakness (generalized): Secondary | ICD-10-CM | POA: Diagnosis not present

## 2014-11-27 DIAGNOSIS — R278 Other lack of coordination: Secondary | ICD-10-CM | POA: Diagnosis not present

## 2014-11-27 DIAGNOSIS — N3941 Urge incontinence: Secondary | ICD-10-CM | POA: Diagnosis not present

## 2014-11-27 DIAGNOSIS — N393 Stress incontinence (female) (male): Secondary | ICD-10-CM | POA: Diagnosis not present

## 2014-12-04 DIAGNOSIS — N393 Stress incontinence (female) (male): Secondary | ICD-10-CM | POA: Diagnosis not present

## 2014-12-04 DIAGNOSIS — N3941 Urge incontinence: Secondary | ICD-10-CM | POA: Diagnosis not present

## 2014-12-04 DIAGNOSIS — R278 Other lack of coordination: Secondary | ICD-10-CM | POA: Diagnosis not present

## 2014-12-04 DIAGNOSIS — M6281 Muscle weakness (generalized): Secondary | ICD-10-CM | POA: Diagnosis not present

## 2014-12-06 DIAGNOSIS — H4011X1 Primary open-angle glaucoma, mild stage: Secondary | ICD-10-CM | POA: Diagnosis not present

## 2014-12-11 DIAGNOSIS — R278 Other lack of coordination: Secondary | ICD-10-CM | POA: Diagnosis not present

## 2014-12-11 DIAGNOSIS — M6281 Muscle weakness (generalized): Secondary | ICD-10-CM | POA: Diagnosis not present

## 2014-12-11 DIAGNOSIS — N393 Stress incontinence (female) (male): Secondary | ICD-10-CM | POA: Diagnosis not present

## 2014-12-11 DIAGNOSIS — N3941 Urge incontinence: Secondary | ICD-10-CM | POA: Diagnosis not present

## 2015-01-01 DIAGNOSIS — N3941 Urge incontinence: Secondary | ICD-10-CM | POA: Diagnosis not present

## 2015-01-01 DIAGNOSIS — M6281 Muscle weakness (generalized): Secondary | ICD-10-CM | POA: Diagnosis not present

## 2015-01-01 DIAGNOSIS — N393 Stress incontinence (female) (male): Secondary | ICD-10-CM | POA: Diagnosis not present

## 2015-01-01 DIAGNOSIS — R278 Other lack of coordination: Secondary | ICD-10-CM | POA: Diagnosis not present

## 2015-01-15 DIAGNOSIS — E119 Type 2 diabetes mellitus without complications: Secondary | ICD-10-CM | POA: Diagnosis not present

## 2015-01-15 DIAGNOSIS — E785 Hyperlipidemia, unspecified: Secondary | ICD-10-CM | POA: Diagnosis not present

## 2015-01-15 DIAGNOSIS — R8290 Unspecified abnormal findings in urine: Secondary | ICD-10-CM | POA: Diagnosis not present

## 2015-01-15 DIAGNOSIS — M810 Age-related osteoporosis without current pathological fracture: Secondary | ICD-10-CM | POA: Diagnosis not present

## 2015-01-22 DIAGNOSIS — Z6827 Body mass index (BMI) 27.0-27.9, adult: Secondary | ICD-10-CM | POA: Diagnosis not present

## 2015-01-22 DIAGNOSIS — E119 Type 2 diabetes mellitus without complications: Secondary | ICD-10-CM | POA: Diagnosis not present

## 2015-01-22 DIAGNOSIS — E785 Hyperlipidemia, unspecified: Secondary | ICD-10-CM | POA: Diagnosis not present

## 2015-01-22 DIAGNOSIS — Z1389 Encounter for screening for other disorder: Secondary | ICD-10-CM | POA: Diagnosis not present

## 2015-01-22 DIAGNOSIS — R32 Unspecified urinary incontinence: Secondary | ICD-10-CM | POA: Diagnosis not present

## 2015-01-22 DIAGNOSIS — I4430 Unspecified atrioventricular block: Secondary | ICD-10-CM | POA: Diagnosis not present

## 2015-01-22 DIAGNOSIS — Z0000 Encounter for general adult medical examination without abnormal findings: Secondary | ICD-10-CM | POA: Diagnosis not present

## 2015-01-22 DIAGNOSIS — M810 Age-related osteoporosis without current pathological fracture: Secondary | ICD-10-CM | POA: Diagnosis not present

## 2015-01-22 DIAGNOSIS — Z950 Presence of cardiac pacemaker: Secondary | ICD-10-CM | POA: Diagnosis not present

## 2015-01-22 DIAGNOSIS — I10 Essential (primary) hypertension: Secondary | ICD-10-CM | POA: Diagnosis not present

## 2015-01-22 DIAGNOSIS — Z23 Encounter for immunization: Secondary | ICD-10-CM | POA: Diagnosis not present

## 2015-01-22 DIAGNOSIS — L988 Other specified disorders of the skin and subcutaneous tissue: Secondary | ICD-10-CM | POA: Diagnosis not present

## 2015-01-23 DIAGNOSIS — Z1212 Encounter for screening for malignant neoplasm of rectum: Secondary | ICD-10-CM | POA: Diagnosis not present

## 2015-01-29 DIAGNOSIS — Z950 Presence of cardiac pacemaker: Secondary | ICD-10-CM | POA: Diagnosis not present

## 2015-01-29 DIAGNOSIS — I350 Nonrheumatic aortic (valve) stenosis: Secondary | ICD-10-CM | POA: Diagnosis not present

## 2015-01-29 DIAGNOSIS — E119 Type 2 diabetes mellitus without complications: Secondary | ICD-10-CM | POA: Diagnosis not present

## 2015-01-29 DIAGNOSIS — Z952 Presence of prosthetic heart valve: Secondary | ICD-10-CM | POA: Diagnosis not present

## 2015-01-29 DIAGNOSIS — E785 Hyperlipidemia, unspecified: Secondary | ICD-10-CM | POA: Diagnosis not present

## 2015-01-29 DIAGNOSIS — I2510 Atherosclerotic heart disease of native coronary artery without angina pectoris: Secondary | ICD-10-CM | POA: Diagnosis not present

## 2015-01-29 DIAGNOSIS — I482 Chronic atrial fibrillation: Secondary | ICD-10-CM | POA: Diagnosis not present

## 2015-02-05 DIAGNOSIS — I350 Nonrheumatic aortic (valve) stenosis: Secondary | ICD-10-CM | POA: Diagnosis not present

## 2015-02-05 DIAGNOSIS — Z950 Presence of cardiac pacemaker: Secondary | ICD-10-CM | POA: Diagnosis not present

## 2015-02-05 DIAGNOSIS — I2510 Atherosclerotic heart disease of native coronary artery without angina pectoris: Secondary | ICD-10-CM | POA: Diagnosis not present

## 2015-02-05 DIAGNOSIS — I482 Chronic atrial fibrillation: Secondary | ICD-10-CM | POA: Diagnosis not present

## 2015-02-05 DIAGNOSIS — E785 Hyperlipidemia, unspecified: Secondary | ICD-10-CM | POA: Diagnosis not present

## 2015-02-05 DIAGNOSIS — Z952 Presence of prosthetic heart valve: Secondary | ICD-10-CM | POA: Diagnosis not present

## 2015-02-05 DIAGNOSIS — E119 Type 2 diabetes mellitus without complications: Secondary | ICD-10-CM | POA: Diagnosis not present

## 2015-04-01 DIAGNOSIS — L218 Other seborrheic dermatitis: Secondary | ICD-10-CM | POA: Diagnosis not present

## 2015-04-01 DIAGNOSIS — D692 Other nonthrombocytopenic purpura: Secondary | ICD-10-CM | POA: Diagnosis not present

## 2015-04-01 DIAGNOSIS — L7211 Pilar cyst: Secondary | ICD-10-CM | POA: Diagnosis not present

## 2015-04-01 DIAGNOSIS — D2272 Melanocytic nevi of left lower limb, including hip: Secondary | ICD-10-CM | POA: Diagnosis not present

## 2015-04-01 DIAGNOSIS — L570 Actinic keratosis: Secondary | ICD-10-CM | POA: Diagnosis not present

## 2015-04-01 DIAGNOSIS — L821 Other seborrheic keratosis: Secondary | ICD-10-CM | POA: Diagnosis not present

## 2015-04-01 DIAGNOSIS — D2271 Melanocytic nevi of right lower limb, including hip: Secondary | ICD-10-CM | POA: Diagnosis not present

## 2015-05-13 DIAGNOSIS — E119 Type 2 diabetes mellitus without complications: Secondary | ICD-10-CM | POA: Diagnosis not present

## 2015-05-13 DIAGNOSIS — Z23 Encounter for immunization: Secondary | ICD-10-CM | POA: Diagnosis not present

## 2015-05-13 DIAGNOSIS — I10 Essential (primary) hypertension: Secondary | ICD-10-CM | POA: Diagnosis not present

## 2015-05-13 DIAGNOSIS — Z6827 Body mass index (BMI) 27.0-27.9, adult: Secondary | ICD-10-CM | POA: Diagnosis not present

## 2015-05-14 DIAGNOSIS — I482 Chronic atrial fibrillation: Secondary | ICD-10-CM | POA: Diagnosis not present

## 2015-05-14 DIAGNOSIS — I350 Nonrheumatic aortic (valve) stenosis: Secondary | ICD-10-CM | POA: Diagnosis not present

## 2015-05-14 DIAGNOSIS — E119 Type 2 diabetes mellitus without complications: Secondary | ICD-10-CM | POA: Diagnosis not present

## 2015-05-14 DIAGNOSIS — I2510 Atherosclerotic heart disease of native coronary artery without angina pectoris: Secondary | ICD-10-CM | POA: Diagnosis not present

## 2015-05-14 DIAGNOSIS — Z950 Presence of cardiac pacemaker: Secondary | ICD-10-CM | POA: Diagnosis not present

## 2015-05-14 DIAGNOSIS — Z952 Presence of prosthetic heart valve: Secondary | ICD-10-CM | POA: Diagnosis not present

## 2015-05-14 DIAGNOSIS — E785 Hyperlipidemia, unspecified: Secondary | ICD-10-CM | POA: Diagnosis not present

## 2015-06-07 DIAGNOSIS — H401131 Primary open-angle glaucoma, bilateral, mild stage: Secondary | ICD-10-CM | POA: Diagnosis not present

## 2015-07-15 DIAGNOSIS — E785 Hyperlipidemia, unspecified: Secondary | ICD-10-CM | POA: Diagnosis not present

## 2015-07-15 DIAGNOSIS — I350 Nonrheumatic aortic (valve) stenosis: Secondary | ICD-10-CM | POA: Diagnosis not present

## 2015-07-15 DIAGNOSIS — I2510 Atherosclerotic heart disease of native coronary artery without angina pectoris: Secondary | ICD-10-CM | POA: Diagnosis not present

## 2015-07-15 DIAGNOSIS — I359 Nonrheumatic aortic valve disorder, unspecified: Secondary | ICD-10-CM | POA: Diagnosis not present

## 2015-07-15 DIAGNOSIS — Z950 Presence of cardiac pacemaker: Secondary | ICD-10-CM | POA: Diagnosis not present

## 2015-07-15 DIAGNOSIS — I482 Chronic atrial fibrillation: Secondary | ICD-10-CM | POA: Diagnosis not present

## 2015-07-15 DIAGNOSIS — E119 Type 2 diabetes mellitus without complications: Secondary | ICD-10-CM | POA: Diagnosis not present

## 2015-07-15 DIAGNOSIS — Z952 Presence of prosthetic heart valve: Secondary | ICD-10-CM | POA: Diagnosis not present

## 2015-08-16 DIAGNOSIS — I350 Nonrheumatic aortic (valve) stenosis: Secondary | ICD-10-CM | POA: Diagnosis not present

## 2015-08-16 DIAGNOSIS — I359 Nonrheumatic aortic valve disorder, unspecified: Secondary | ICD-10-CM | POA: Diagnosis not present

## 2015-08-16 DIAGNOSIS — E785 Hyperlipidemia, unspecified: Secondary | ICD-10-CM | POA: Diagnosis not present

## 2015-08-16 DIAGNOSIS — Z950 Presence of cardiac pacemaker: Secondary | ICD-10-CM | POA: Diagnosis not present

## 2015-08-16 DIAGNOSIS — E119 Type 2 diabetes mellitus without complications: Secondary | ICD-10-CM | POA: Diagnosis not present

## 2015-08-16 DIAGNOSIS — I2510 Atherosclerotic heart disease of native coronary artery without angina pectoris: Secondary | ICD-10-CM | POA: Diagnosis not present

## 2015-08-16 DIAGNOSIS — Z952 Presence of prosthetic heart valve: Secondary | ICD-10-CM | POA: Diagnosis not present

## 2015-09-09 DIAGNOSIS — E119 Type 2 diabetes mellitus without complications: Secondary | ICD-10-CM | POA: Diagnosis not present

## 2015-09-09 DIAGNOSIS — M25562 Pain in left knee: Secondary | ICD-10-CM | POA: Diagnosis not present

## 2015-09-09 DIAGNOSIS — I10 Essential (primary) hypertension: Secondary | ICD-10-CM | POA: Diagnosis not present

## 2015-09-09 DIAGNOSIS — Z6827 Body mass index (BMI) 27.0-27.9, adult: Secondary | ICD-10-CM | POA: Diagnosis not present

## 2015-10-20 ENCOUNTER — Observation Stay (HOSPITAL_COMMUNITY): Payer: Medicare Other

## 2015-10-20 ENCOUNTER — Observation Stay (HOSPITAL_COMMUNITY): Payer: Medicare Other | Admitting: Anesthesiology

## 2015-10-20 ENCOUNTER — Encounter (HOSPITAL_COMMUNITY)
Admission: EM | Disposition: A | Discharge status: Home Health | Payer: Self-pay | Source: Home / Self Care | Attending: Neurology

## 2015-10-20 ENCOUNTER — Emergency Department (HOSPITAL_COMMUNITY): Payer: Medicare Other

## 2015-10-20 ENCOUNTER — Encounter (HOSPITAL_COMMUNITY): Payer: Self-pay | Admitting: Emergency Medicine

## 2015-10-20 ENCOUNTER — Inpatient Hospital Stay (HOSPITAL_COMMUNITY)
Admission: EM | Admit: 2015-10-20 | Discharge: 2015-10-22 | DRG: 066 | Disposition: A | Discharge status: Home Health | Payer: Medicare Other | Attending: Internal Medicine | Admitting: Internal Medicine

## 2015-10-20 DIAGNOSIS — Z952 Presence of prosthetic heart valve: Secondary | ICD-10-CM

## 2015-10-20 DIAGNOSIS — I639 Cerebral infarction, unspecified: Secondary | ICD-10-CM | POA: Insufficient documentation

## 2015-10-20 DIAGNOSIS — Z79899 Other long term (current) drug therapy: Secondary | ICD-10-CM

## 2015-10-20 DIAGNOSIS — I6789 Other cerebrovascular disease: Secondary | ICD-10-CM | POA: Diagnosis not present

## 2015-10-20 DIAGNOSIS — R110 Nausea: Secondary | ICD-10-CM | POA: Diagnosis not present

## 2015-10-20 DIAGNOSIS — R270 Ataxia, unspecified: Secondary | ICD-10-CM | POA: Diagnosis present

## 2015-10-20 DIAGNOSIS — E119 Type 2 diabetes mellitus without complications: Secondary | ICD-10-CM

## 2015-10-20 DIAGNOSIS — I10 Essential (primary) hypertension: Secondary | ICD-10-CM | POA: Diagnosis not present

## 2015-10-20 DIAGNOSIS — I6330 Cerebral infarction due to thrombosis of unspecified cerebral artery: Secondary | ICD-10-CM | POA: Insufficient documentation

## 2015-10-20 DIAGNOSIS — R112 Nausea with vomiting, unspecified: Secondary | ICD-10-CM | POA: Diagnosis present

## 2015-10-20 DIAGNOSIS — G459 Transient cerebral ischemic attack, unspecified: Secondary | ICD-10-CM

## 2015-10-20 DIAGNOSIS — Z7951 Long term (current) use of inhaled steroids: Secondary | ICD-10-CM | POA: Diagnosis not present

## 2015-10-20 DIAGNOSIS — Z7982 Long term (current) use of aspirin: Secondary | ICD-10-CM | POA: Diagnosis not present

## 2015-10-20 DIAGNOSIS — I63211 Cerebral infarction due to unspecified occlusion or stenosis of right vertebral arteries: Secondary | ICD-10-CM | POA: Diagnosis not present

## 2015-10-20 DIAGNOSIS — Z7984 Long term (current) use of oral hypoglycemic drugs: Secondary | ICD-10-CM

## 2015-10-20 DIAGNOSIS — Z950 Presence of cardiac pacemaker: Secondary | ICD-10-CM

## 2015-10-20 DIAGNOSIS — R9089 Other abnormal findings on diagnostic imaging of central nervous system: Secondary | ICD-10-CM | POA: Diagnosis not present

## 2015-10-20 DIAGNOSIS — J45909 Unspecified asthma, uncomplicated: Secondary | ICD-10-CM | POA: Diagnosis present

## 2015-10-20 DIAGNOSIS — I482 Chronic atrial fibrillation: Secondary | ICD-10-CM | POA: Diagnosis present

## 2015-10-20 DIAGNOSIS — Z951 Presence of aortocoronary bypass graft: Secondary | ICD-10-CM

## 2015-10-20 DIAGNOSIS — I651 Occlusion and stenosis of basilar artery: Secondary | ICD-10-CM | POA: Diagnosis present

## 2015-10-20 DIAGNOSIS — R2990 Unspecified symptoms and signs involving the nervous system: Secondary | ICD-10-CM | POA: Diagnosis not present

## 2015-10-20 DIAGNOSIS — I6501 Occlusion and stenosis of right vertebral artery: Secondary | ICD-10-CM | POA: Diagnosis not present

## 2015-10-20 DIAGNOSIS — H409 Unspecified glaucoma: Secondary | ICD-10-CM | POA: Diagnosis present

## 2015-10-20 DIAGNOSIS — R42 Dizziness and giddiness: Secondary | ICD-10-CM

## 2015-10-20 DIAGNOSIS — R531 Weakness: Secondary | ICD-10-CM | POA: Diagnosis not present

## 2015-10-20 DIAGNOSIS — R079 Chest pain, unspecified: Secondary | ICD-10-CM | POA: Diagnosis not present

## 2015-10-20 DIAGNOSIS — I2510 Atherosclerotic heart disease of native coronary artery without angina pectoris: Secondary | ICD-10-CM | POA: Diagnosis present

## 2015-10-20 DIAGNOSIS — I6312 Cerebral infarction due to embolism of basilar artery: Secondary | ICD-10-CM

## 2015-10-20 DIAGNOSIS — E785 Hyperlipidemia, unspecified: Secondary | ICD-10-CM | POA: Diagnosis present

## 2015-10-20 DIAGNOSIS — J301 Allergic rhinitis due to pollen: Secondary | ICD-10-CM | POA: Diagnosis present

## 2015-10-20 DIAGNOSIS — R404 Transient alteration of awareness: Secondary | ICD-10-CM | POA: Diagnosis not present

## 2015-10-20 HISTORY — PX: RADIOLOGY WITH ANESTHESIA: SHX6223

## 2015-10-20 LAB — COMPREHENSIVE METABOLIC PANEL
ALT: 16 U/L (ref 14–54)
AST: 21 U/L (ref 15–41)
Albumin: 3.8 g/dL (ref 3.5–5.0)
Alkaline Phosphatase: 40 U/L (ref 38–126)
Anion gap: 14 (ref 5–15)
BILIRUBIN TOTAL: 0.6 mg/dL (ref 0.3–1.2)
BUN: 21 mg/dL — ABNORMAL HIGH (ref 6–20)
CHLORIDE: 104 mmol/L (ref 101–111)
CO2: 23 mmol/L (ref 22–32)
Calcium: 10.1 mg/dL (ref 8.9–10.3)
Creatinine, Ser: 0.92 mg/dL (ref 0.44–1.00)
GFR, EST NON AFRICAN AMERICAN: 57 mL/min — AB (ref >60)
Glucose, Bld: 265 mg/dL — ABNORMAL HIGH (ref 65–99)
POTASSIUM: 3.5 mmol/L (ref 3.5–5.1)
Sodium: 141 mmol/L (ref 135–145)
TOTAL PROTEIN: 6 g/dL — AB (ref 6.5–8.1)

## 2015-10-20 LAB — RAPID URINE DRUG SCREEN, HOSP PERFORMED
Amphetamines: NOT DETECTED
BENZODIAZEPINES: NOT DETECTED
Barbiturates: NOT DETECTED
COCAINE: NOT DETECTED
Opiates: NOT DETECTED
Tetrahydrocannabinol: NOT DETECTED

## 2015-10-20 LAB — CBC
HCT: 38.1 % (ref 36.0–46.0)
Hemoglobin: 12 g/dL (ref 12.0–15.0)
MCH: 27.3 pg (ref 26.0–34.0)
MCHC: 31.5 g/dL (ref 30.0–36.0)
MCV: 86.6 fL (ref 78.0–100.0)
PLATELETS: 161 10*3/uL (ref 150–400)
RBC: 4.4 MIL/uL (ref 3.87–5.11)
RDW: 13.5 % (ref 11.5–15.5)
WBC: 9.3 10*3/uL (ref 4.0–10.5)

## 2015-10-20 LAB — APTT: aPTT: 24 seconds (ref 24–37)

## 2015-10-20 LAB — LIPID PANEL
Cholesterol: 137 mg/dL (ref 0–200)
HDL: 48 mg/dL (ref >40)
LDL CALC: 65 mg/dL (ref 0–99)
Total CHOL/HDL Ratio: 2.9 RATIO
Triglycerides: 119 mg/dL (ref <150)
VLDL: 24 mg/dL (ref 0–40)

## 2015-10-20 LAB — DIFFERENTIAL
BASOS PCT: 0 %
Basophils Absolute: 0 10*3/uL (ref 0.0–0.1)
EOS ABS: 0.2 10*3/uL (ref 0.0–0.7)
EOS PCT: 2 %
LYMPHS ABS: 1.5 10*3/uL (ref 0.7–4.0)
Lymphocytes Relative: 16 %
MONO ABS: 0.5 10*3/uL (ref 0.1–1.0)
Monocytes Relative: 5 %
NEUTROS ABS: 7.1 10*3/uL (ref 1.7–7.7)
Neutrophils Relative %: 77 %

## 2015-10-20 LAB — URINALYSIS, ROUTINE W REFLEX MICROSCOPIC
Bilirubin Urine: NEGATIVE
GLUCOSE, UA: 250 mg/dL — AB
HGB URINE DIPSTICK: NEGATIVE
Ketones, ur: 15 mg/dL — AB
LEUKOCYTES UA: NEGATIVE
Nitrite: NEGATIVE
PROTEIN: NEGATIVE mg/dL
SPECIFIC GRAVITY, URINE: 1.024 (ref 1.005–1.030)
pH: 7 (ref 5.0–8.0)

## 2015-10-20 LAB — I-STAT TROPONIN, ED: TROPONIN I, POC: 0 ng/mL (ref 0.00–0.08)

## 2015-10-20 LAB — PROTIME-INR
INR: 0.93 (ref 0.00–1.49)
PROTHROMBIN TIME: 12.7 s (ref 11.6–15.2)

## 2015-10-20 LAB — CBG MONITORING, ED: GLUCOSE-CAPILLARY: 148 mg/dL — AB (ref 65–99)

## 2015-10-20 LAB — ETHANOL

## 2015-10-20 LAB — GLUCOSE, CAPILLARY
GLUCOSE-CAPILLARY: 150 mg/dL — AB (ref 65–99)
GLUCOSE-CAPILLARY: 199 mg/dL — AB (ref 65–99)

## 2015-10-20 LAB — MRSA PCR SCREENING: MRSA by PCR: NEGATIVE

## 2015-10-20 SURGERY — RADIOLOGY WITH ANESTHESIA
Anesthesia: Monitor Anesthesia Care

## 2015-10-20 MED ORDER — HEPARIN SODIUM (PORCINE) 1000 UNIT/ML IJ SOLN
INTRAMUSCULAR | Status: DC | PRN
  Administered 2015-10-20: 1000 [IU] via INTRAVENOUS

## 2015-10-20 MED ORDER — SODIUM CHLORIDE 0.9 % IV SOLN
INTRAVENOUS | Status: DC
  Administered 2015-10-20: 17:00:00 via INTRAVENOUS

## 2015-10-20 MED ORDER — GLUCOSAMINE CHONDROITIN JOINT PO TABS: ORAL_TABLET | Freq: Two times a day (BID) | ORAL | Status: DC

## 2015-10-20 MED ORDER — ASPIRIN EC 81 MG PO TBEC: 162.0000 mg | DELAYED_RELEASE_TABLET | Freq: Every morning | ORAL | Status: DC

## 2015-10-20 MED ORDER — ALBUTEROL SULFATE (2.5 MG/3ML) 0.083% IN NEBU: 2.5000 mg | INHALATION_SOLUTION | Freq: Four times a day (QID) | RESPIRATORY_TRACT | Status: DC | PRN

## 2015-10-20 MED ORDER — HEPARIN SOD (PORK) LOCK FLUSH 100 UNIT/ML IV SOLN
INTRAVENOUS | Status: AC
  Filled 2015-10-20: qty 5

## 2015-10-20 MED ORDER — DOCUSATE SODIUM 100 MG PO CAPS
100.0000 mg | ORAL_CAPSULE | Freq: Two times a day (BID) | ORAL | Status: DC
  Administered 2015-10-20 – 2015-10-22 (×3): 100 mg via ORAL
  Filled 2015-10-20 (×3): qty 1

## 2015-10-20 MED ORDER — SODIUM CHLORIDE 0.9 % IV BOLUS (SEPSIS): 250.0000 mL | Freq: Once | INTRAVENOUS | Status: DC

## 2015-10-20 MED ORDER — SODIUM CHLORIDE 0.9 % IV BOLUS (SEPSIS)
1000.0000 mL | Freq: Once | INTRAVENOUS | Status: AC
  Administered 2015-10-20: 1000 mL via INTRAVENOUS

## 2015-10-20 MED ORDER — DORZOLAMIDE HCL-TIMOLOL MAL 2-0.5 % OP SOLN
1.0000 [drp] | Freq: Two times a day (BID) | OPHTHALMIC | Status: DC
Start: 2015-10-20 — End: 2015-10-22
  Administered 2015-10-20 – 2015-10-22 (×4): 1 [drp] via OPHTHALMIC
  Filled 2015-10-20 (×2): qty 10

## 2015-10-20 MED ORDER — FENTANYL CITRATE (PF) 250 MCG/5ML IJ SOLN
INTRAMUSCULAR | Status: DC | PRN
  Administered 2015-10-20 (×2): 25 ug via INTRAVENOUS

## 2015-10-20 MED ORDER — CEFAZOLIN SODIUM-DEXTROSE 2-3 GM-% IV SOLR
INTRAVENOUS | Status: AC
  Filled 2015-10-20: qty 50

## 2015-10-20 MED ORDER — INSULIN ASPART 100 UNIT/ML ~~LOC~~ SOLN: 0.0000 [IU] | SUBCUTANEOUS | Status: DC

## 2015-10-20 MED ORDER — ENOXAPARIN SODIUM 40 MG/0.4ML ~~LOC~~ SOLN
40.0000 mg | SUBCUTANEOUS | Status: DC
Start: 2015-10-20 — End: 2015-10-20

## 2015-10-20 MED ORDER — IOHEXOL 300 MG/ML  SOLN
150.0000 mL | Freq: Once | INTRAMUSCULAR | Status: AC | PRN
  Administered 2015-10-20: 100 mL via INTRA_ARTERIAL

## 2015-10-20 MED ORDER — FAMOTIDINE IN NACL 20-0.9 MG/50ML-% IV SOLN
20.0000 mg | Freq: Two times a day (BID) | INTRAVENOUS | Status: DC
  Administered 2015-10-20 – 2015-10-21 (×3): 20 mg via INTRAVENOUS
  Filled 2015-10-20 (×3): qty 50

## 2015-10-20 MED ORDER — ROSUVASTATIN CALCIUM 10 MG PO TABS
10.0000 mg | ORAL_TABLET | Freq: Every evening | ORAL | Status: DC
  Administered 2015-10-20 – 2015-10-22 (×3): 10 mg via ORAL
  Filled 2015-10-20 (×3): qty 1

## 2015-10-20 MED ORDER — LATANOPROST 0.005 % OP SOLN
1.0000 [drp] | Freq: Every day | OPHTHALMIC | Status: DC
  Administered 2015-10-20 – 2015-10-21 (×2): 1 [drp] via OPHTHALMIC
  Filled 2015-10-20 (×2): qty 2.5

## 2015-10-20 MED ORDER — MIDAZOLAM HCL 2 MG/2ML IJ SOLN
INTRAMUSCULAR | Status: DC | PRN
  Administered 2015-10-20: 1 mg via INTRAVENOUS

## 2015-10-20 MED ORDER — STROKE: EARLY STAGES OF RECOVERY BOOK
Freq: Once | Status: DC
  Filled 2015-10-20: qty 1

## 2015-10-20 MED ORDER — PROMETHAZINE HCL 25 MG/ML IJ SOLN
12.5000 mg | Freq: Once | INTRAMUSCULAR | Status: AC
  Administered 2015-10-20: 12.5 mg via INTRAVENOUS
  Filled 2015-10-20: qty 1

## 2015-10-20 MED ORDER — SODIUM CHLORIDE 0.9 % IV SOLN
INTRAVENOUS | Status: DC | PRN
Start: 2015-10-20 — End: 2015-10-20
  Administered 2015-10-20: 15:00:00 via INTRAVENOUS

## 2015-10-20 MED ORDER — INSULIN ASPART 100 UNIT/ML ~~LOC~~ SOLN
0.0000 [IU] | Freq: Three times a day (TID) | SUBCUTANEOUS | Status: DC
  Administered 2015-10-20 – 2015-10-22 (×4): 2 [IU] via SUBCUTANEOUS

## 2015-10-20 MED ORDER — SENNOSIDES-DOCUSATE SODIUM 8.6-50 MG PO TABS: 1.0000 | ORAL_TABLET | Freq: Every evening | ORAL | Status: DC | PRN

## 2015-10-20 MED ORDER — IOHEXOL 350 MG/ML SOLN
50.0000 mL | Freq: Once | INTRAVENOUS | Status: AC | PRN
  Administered 2015-10-20: 50 mL via INTRAVENOUS

## 2015-10-20 MED ORDER — INSULIN ASPART 100 UNIT/ML ~~LOC~~ SOLN: 4.0000 [IU] | Freq: Once | SUBCUTANEOUS | Status: DC

## 2015-10-20 MED ORDER — HEPARIN (PORCINE) IN NACL 100-0.45 UNIT/ML-% IJ SOLN
1050.0000 [IU]/h | INTRAMUSCULAR | Status: DC
  Administered 2015-10-20: 950 [IU]/h via INTRAVENOUS
  Filled 2015-10-20 (×3): qty 250

## 2015-10-20 MED ORDER — ALBUTEROL SULFATE HFA 108 (90 BASE) MCG/ACT IN AERS: 2.0000 | INHALATION_SPRAY | RESPIRATORY_TRACT | Status: DC | PRN

## 2015-10-20 MED ORDER — METHYLPREDNISOLONE SODIUM SUCC 125 MG IJ SOLR
80.0000 mg | Freq: Four times a day (QID) | INTRAMUSCULAR | Status: DC
  Administered 2015-10-20 (×2): 80 mg via INTRAVENOUS
  Filled 2015-10-20 (×2): qty 2

## 2015-10-20 MED ORDER — LIDOCAINE HCL 1 % IJ SOLN
INTRAMUSCULAR | Status: AC
Start: 2015-10-20 — End: 2015-10-21
  Filled 2015-10-20: qty 20

## 2015-10-20 MED ORDER — INSULIN ASPART 100 UNIT/ML ~~LOC~~ SOLN
0.0000 [IU] | Freq: Every day | SUBCUTANEOUS | Status: DC
  Administered 2015-10-21: 2 [IU] via SUBCUTANEOUS

## 2015-10-20 NOTE — ED Notes (Signed)
Patient transported to CT 

## 2015-10-20 NOTE — ED Notes (Signed)
Per EMS: pt woke up this am and when stood up pt sts dizziness and "room spinning" pt feel against wall and then to floor; pt with some slurred speech that has now resolved and no other neuro sx; pt was diaphoretic for EMS; 20g R hand; pt given 8mg  zofran in route

## 2015-10-20 NOTE — H&P (Signed)
[facility] History and Physical  [name] [id number] DOB: 15-Oct-1932 DOA: 10/20/2015  Referring physician: Rondel Oh PCP: Velna Hatchet, MD   Chief Complaint: dizziness/dyarthria  HPI: Evelyn Collins is a very pleasant 82 y.o. female with a past medical history that includes CABG, pacemaker, aortic valve replacement, previous stroke in 2010, hypertension, diabetes, asthma presents to the emergency department with the chief complaint of dizziness and difficulty speaking.  Initial evaluation in the emergency department concerning for TIA.  Information is obtained from the patient and her daughter who is at the bedside. She said she awakened this morning she sat up and felt "very dizzy. She states that "the room seemed to be spinning". She walked up against the wall and ambulated to the bed was unable to sit on the bed slid to the floor was lying face down on the floor unable to get up. She reports trying to call out her husband and was unable to do so. Associated symptoms include diaphoresis nausea with one episode of clear emesis. She denies chest pain palpitation headache visual disturbances numbness or tingling of extremities. She denies any difficulty swallowing. She denies abdominal pain dysuria hematuria frequency or urgency. She denies any recent fever chills cough shortness of breath lower extremity edema. She called EMS and reports they did not detect any unilateral weakness facial droop or slurred speech  Emergency department she is a rectal temp of 95.8 she's hemodynamically stable and not hypoxic. She is provided with IV fluids, solumedrol, phenergan  Evaluated by neurology who orders CT ANGIO of the head and neck  Review of Systems:  And point review of history completed all systems negative except as indicated in the history of present illness   Past Medical History  Diagnosis Date  . H/O aortic valve replacement   . Inguinal hernia   . Hyperlipidemia   .  Diabetes mellitus without complication ([place])   . Glaucoma   . Hypertension   . Allergy to pollen   . Pacemaker   . Coronary artery disease   . Asthma   . Constipation   . Arthritis   . Stroke [facility]) 2010ish    no residual  . Glaucoma    Past Surgical History  Procedure Laterality Date  . Hernia repair      umbilical hernia x2  . Tonsillectomy    . Exploration post operative open heart    . Coronary artery bypass graft  2006  . Aortic valve replacement  2006  . Eye surgery Bilateral     cataract  . Inguinal hernia repair Right 12/21/2013    Procedure: HERNIA REPAIR INGUINAL ADULT;  Surgeon: Odis Hollingshead, MD;  Location: [facility];  Service: General;  Laterality: Right;  . Insertion of mesh Right 12/21/2013    Procedure: INSERTION OF MESH;  Surgeon: Odis Hollingshead, MD;  Location: [facility];  Service: General;  Laterality: Right;   Social History:  reports that she has never smoked. She has never used smokeless tobacco. She reports that she does not drink alcohol or use illicit drugs. Lives at home with her husband who has dementia as his primary caregiver. She is retired she is independent with ADLs Allergies  Allergen Reactions  . Other     Eye drop for glaucoma cannot remember name - gave her palpitations    History reviewed. No pertinent family history. Family medical history reviewed is positive for stroke diabetes. No cancer  Prior to Admission medications   Medication Sig Start  Date End Date Taking? Authorizing Provider  ACCU-CHEK FASTCLIX LANCETS MISC by Does not apply route.   Yes Historical Provider, MD  albuterol (PROVENTIL HFA;VENTOLIN HFA) 108 (90 BASE) MCG/ACT inhaler Inhale 2 puffs into the lungs every 4 (four) hours as needed for shortness of breath (cough).   Yes Historical Provider, MD  albuterol (PROVENTIL) (2.5 MG/3ML) 0.083% nebulizer solution Take 2.5 mg by nebulization every 6 (six) hours as needed for wheezing or shortness of breath.   Yes Historical  Provider, MD  alendronate (FOSAMAX) 70 MG tablet Take 70 mg by mouth every Monday. Take with a full glass of water on an empty stomach.   Yes Historical Provider, MD  aspirin EC 81 MG tablet Take 162 mg by mouth every morning.    Yes Historical Provider, MD  Cholecalciferol (VITAMIN D) 2000 UNITS CAPS Take 2,000 Units by mouth every morning.   Yes Historical Provider, MD  docusate sodium (COLACE) 100 MG capsule Take 100 mg by mouth 2 (two) times daily.   Yes Historical Provider, MD  dorzolamide-timolol (COSOPT) 22.3-6.8 MG/ML ophthalmic solution Place 1 drop into both eyes 2 (two) times daily.   Yes Historical Provider, MD  Glucos-Chondroit-Hyaluron-MSM (GLUCOSAMINE CHONDROITIN JOINT PO) Take 1 tablet by mouth 2 (two) times daily.    Yes Historical Provider, MD  latanoprost (XALATAN) 0.005 % ophthalmic solution Place 1 drop into both eyes at bedtime.   Yes Historical Provider, MD  losartan (COZAAR) 25 MG tablet Take 25 mg by mouth every morning.    Yes Historical Provider, MD  metFORMIN (GLUCOPHAGE) 1000 MG tablet Take 1,000 mg by mouth 2 (two) times daily with a meal.   Yes Historical Provider, MD  Multiple Vitamin (MULTIVITAMIN WITH MINERALS) TABS tablet Take 1 tablet by mouth every other day.   Yes Historical Provider, MD  Omega-3 Fatty Acids (FISH OIL) 1200 MG CAPS Take 1,200 mg by mouth every morning.   Yes Historical Provider, MD  rosuvastatin (CRESTOR) 20 MG tablet Take 10-20 mg by mouth every evening. Alternates taking 0.5 tablet and 1 tablet daily   Yes Historical Provider, MD   Physical Exam: Filed Vitals:   10/20/15 1014 10/20/15 1022 10/20/15 1108 10/20/15 1221  BP: 165/73 154/75 130/67 98/71  Pulse: 54 73 71 75  Temp:  97.2 F (36.2 C) 95.8 F (35.4 C)   TempSrc:  Oral Rectal   Resp: 16 20 20 16   SpO2: 95% 100% 100% 95%    Wt Readings from Last 3 Encounters:  01/03/14 74.118 kg (163 lb 6.4 oz)  12/21/13 75.297 kg (166 lb)  12/18/13 75.6 kg (166 lb 10.7 oz)    General:   Appears calm and comfortable Eyes: PERRL, normal lids, irises & conjunctiva ENT: grossly normal hearing, His membranes of her mouth are pink but dry Neck: no LAD, masses or thyromegaly Cardiovascular: RRR, +murmur. No LE edema. Pedal pulses present and palpable Respiratory: CTA bilaterally, no w/r/r. Normal respiratory effort. Abdomen: soft, ntnd positive bowel sounds no guarding or rebounding Skin: no rash or induration seen on limited exam Musculoskeletal: grossly normal tone BUE/BLE, joints without swelling/erythema Psychiatric: grossly normal mood and affect, speech fluent and appropriate Neurologic: Oriented 3. Facial symmetry. Tongue midline. No pronator drift. Bilateral lower extremity strength 4 out of 5 speech is slow somewhat slurred          Labs on Admission:  Basic Metabolic Panel:  Recent Labs Lab 10/20/15 1053  NA 141  K 3.5  CL 104  CO2 23  GLUCOSE  265*  BUN 21*  CREATININE 0.92  CALCIUM 10.1   Liver Function Tests:  Recent Labs Lab 10/20/15 1053  AST 21  ALT 16  ALKPHOS 40  BILITOT 0.6  PROT 6.0*  ALBUMIN 3.8   No results for input(s): LIPASE, AMYLASE in the last 168 hours. No results for input(s): AMMONIA in the last 168 hours. CBC:  Recent Labs Lab 10/20/15 1053  WBC 9.3  NEUTROABS 7.1  HGB 12.0  HCT 38.1  MCV 86.6  PLT 161   Cardiac Enzymes: No results for input(s): CKTOTAL, CKMB, CKMBINDEX, TROPONINI in the last 168 hours.  BNP (last 3 results) No results for input(s): BNP in the last 8760 hours.  ProBNP (last 3 results) No results for input(s): PROBNP in the last 8760 hours.  CBG: No results for input(s): GLUCAP in the last 168 hours.  Radiological Exams on Admission: Ct Head Wo Contrast  10/20/2015  CLINICAL DATA:  PT. WOKE THIS AM WITH ROOM SPINNING, NAUSEA, DYSPHASIA WHICH HAS RESOLVED, VOMITING DURING TRANSPORT TO CT, LW EXAM: CT HEAD WITHOUT CONTRAST TECHNIQUE: Contiguous axial images were obtained from the base of the  skull through the vertex without intravenous contrast. COMPARISON:  None. FINDINGS: There is mild central cortical atrophy. There is no intra or extra-axial fluid collection or mass lesion. The basilar cisterns and ventricles have a normal appearance. There is no CT evidence for acute infarction or hemorrhage. Bone windows are unremarkable. IMPRESSION: 1. Atrophy. 2.  No evidence for acute intracranial abnormality. Electronically Signed   By: Nolon Nations M.D.   On: 10/20/2015 10:32    EKG: Independently reviewed. Sinus rhythm Ventricular premature complex Prolonged PR interval Left bundle branch block Artifact  Assessment/Plan Principal Problem:   Stroke-like symptoms Active Problems:   Diabetes mellitus without complication ([place])   Hypertension   Pacemaker   Asthma  #1. Strokelike symptoms. Patient with a history of stroke and risk factors including hypertension diabetes, heart valve replacement hyperlipidemia. CT of the head without acute infarct. CT Angio of  head and neck pending. -Admit to telemetry -Bed side swallow eval -Carb modified diet once he passes -Obtain a 2-D echo  -Obtain lipid panel hemoglobin A1c -Continue aspirin and statin -PT/OT -Speech therapy  2. Diabetes. Reports her last hemoglobin A1c greater than 7. Serum glucose 265 on admission. Home medications include metformin. Patient also received Solu-Medrol in the emergency department -Obtain hemoglobin A1c -4 units of insulin now -Sliding scale insulin for optimal control -Hold metformin for now  3. Hypertension. Pressure on the low end of normal while in the emergency department. Home medications include losartan. -We'll hold losartan for now -Her closely and resume antihypertensives when indicated  4. History of asthma. Appears to be stable at baseline. -Continue home meds  Consult: Neuro   Code Status: full DVT Prophylaxis: Family Communication: husband (has dementia) and daughter at  bedside Disposition Plan: home when ready  Time spent: 51 minutes  [facility]    I have examined the patient, reviewed the chart and agree with above note.  Addendum: The patient has an embolism of the basilar artery. Dr Silverio Decamp states she will go to IR for embolectomy and that he will assume attending role. She will subsequently need to go to the ICU. We will sign off if ICU team assumes care.   Donica Derouin,MD Pager # [email or web address] 10/20/2015, 1:53 PM

## 2015-10-20 NOTE — Anesthesia Postprocedure Evaluation (Signed)
Anesthesia Post Note  Patient: Evelyn Collins  Procedure(s) Performed: Procedure(s) (LRB): RADIOLOGY WITH ANESTHESIA (N/A)  Patient location during evaluation: ICU Anesthesia Type: MAC Level of consciousness: awake, awake and alert and oriented Pain management: satisfactory to patient Vital Signs Assessment: post-procedure vital signs reviewed and stable Respiratory status: spontaneous breathing Cardiovascular status: blood pressure returned to baseline and stable Postop Assessment: no signs of nausea or vomiting Anesthetic complications: no    Last Vitals:  Filed Vitals:   10/20/15 1330 10/20/15 1400  BP: 114/76 94/81  Pulse: 74 74  Temp:    Resp: 14 18    Last Pain: There were no vitals filed for this visit.               Marinda Elk

## 2015-10-20 NOTE — Anesthesia Preprocedure Evaluation (Addendum)
Anesthesia Evaluation  Patient identified by MRN, date of birth, ID band Patient awake    Reviewed: Allergy & Precautions, H&P , NPO status , Patient's Chart, lab work & pertinent test results  Airway Mallampati: III  TM Distance: >3 FB     Dental no notable dental hx. (+) Teeth Intact, Dental Advisory Given   Pulmonary asthma ,    Pulmonary exam normal breath sounds clear to auscultation       Cardiovascular hypertension, Pt. on medications + CAD  + pacemaker  Rhythm:Regular Rate:Normal  S/p AVR   Neuro/Psych TIACVA negative neurological ROS  negative psych ROS   GI/Hepatic negative GI ROS, Neg liver ROS,   Endo/Other  diabetes, Oral Hypoglycemic Agents  Renal/GU negative Renal ROS  negative genitourinary   Musculoskeletal  (+) Arthritis , Osteoarthritis,    Abdominal   Peds  Hematology negative hematology ROS (+)   Anesthesia Other Findings   Reproductive/Obstetrics negative OB ROS                           Anesthesia Physical Anesthesia Plan  ASA: III  Anesthesia Plan: General and MAC   Post-op Pain Management:    Induction: Intravenous, Rapid sequence and Cricoid pressure planned  Airway Management Planned: Oral ETT  Additional Equipment: Arterial line  Intra-op Plan:   Post-operative Plan: Post-operative intubation/ventilation  Informed Consent: I have reviewed the patients History and Physical, chart, labs and discussed the procedure including the risks, benefits and alternatives for the proposed anesthesia with the patient or authorized representative who has indicated his/her understanding and acceptance.   Dental advisory given  Plan Discussed with: CRNA  Anesthesia Plan Comments:        Anesthesia Quick Evaluation

## 2015-10-20 NOTE — Code Documentation (Signed)
Evelyn Collins is a 82yo wfm presenting to [facility] via pvt vehicle forc/o dizziness & nausea.  She reports that she awoke at 0730 she noticed the dizziness when she attempted to walk.  She states she took several steps but fell, though she is unsure to which side.  She does admit to having had dizziness in the past, and denies any HA or pain today. Her family states they felt her speech might be slightly slurred, though no dysarthria was appreciated initially on exam.  She was also found to be ataxic with her gait upon further evaluation.  Due to the time of LKW as 0230 the pt was considered a wake-up stroke and was worked up as such.  Plan to take to IR suite for further evaluation.

## 2015-10-20 NOTE — Progress Notes (Signed)
ANTICOAGULATION CONSULT NOTE - Initial Consult  Pharmacy Consult for heparin Indication: basilar artery tip thrombus  Allergies  Allergen Reactions  . Other     Eye drop for glaucoma cannot remember name - gave her palpitations    Patient Measurements: Height: 5\' 7"  (170.2 cm) Weight: 163 lb 9.3 oz (74.2 kg) IBW/kg (Calculated) : 61.6  Vital Signs: Temp: 98.2 F (36.8 C) (03/19 1700) Temp Source: Rectal (03/19 1108) BP: 95/71 mmHg (03/19 1700) Pulse Rate: 84 (03/19 1700)  Labs:  Recent Labs  10/20/15 1053  HGB 12.0  HCT 38.1  PLT 161  APTT 24  LABPROT 12.7  INR 0.93  CREATININE 0.92    Estimated Creatinine Clearance: 50.4 mL/min (by C-G formula based on Cr of 0.92).   Medical History: Past Medical History  Diagnosis Date  . H/O aortic valve replacement   . Inguinal hernia   . Hyperlipidemia   . Diabetes mellitus without complication ([place])   . Glaucoma   . Hypertension   . Allergy to pollen   . Pacemaker   . Coronary artery disease   . Asthma   . Constipation   . Arthritis   . Stroke [facility]) 2010ish    no residual  . Glaucoma     Medications:  Prescriptions prior to admission  Medication Sig Dispense Refill Last Dose  . ACCU-CHEK FASTCLIX LANCETS MISC by Does not apply route.   Taking  . albuterol (PROVENTIL HFA;VENTOLIN HFA) 108 (90 BASE) MCG/ACT inhaler Inhale 2 puffs into the lungs every 4 (four) hours as needed for shortness of breath (cough).   10/19/2015 at Unknown time  . albuterol (PROVENTIL) (2.5 MG/3ML) 0.083% nebulizer solution Take 2.5 mg by nebulization every 6 (six) hours as needed for wheezing or shortness of breath.   10/19/2015 at Unknown time  . alendronate (FOSAMAX) 70 MG tablet Take 70 mg by mouth every Monday. Take with a full glass of water on an empty stomach.   10/14/2015  . aspirin EC 81 MG tablet Take 162 mg by mouth every morning.    10/19/2015 at Unknown time  . Cholecalciferol (VITAMIN D) 2000 UNITS CAPS Take 2,000 Units by  mouth every morning.   10/19/2015 at Unknown time  . docusate sodium (COLACE) 100 MG capsule Take 100 mg by mouth 2 (two) times daily.   10/19/2015 at Unknown time  . dorzolamide-timolol (COSOPT) 22.3-6.8 MG/ML ophthalmic solution Place 1 drop into both eyes 2 (two) times daily.   10/19/2015 at Unknown time  . Glucos-Chondroit-Hyaluron-MSM (GLUCOSAMINE CHONDROITIN JOINT PO) Take 1 tablet by mouth 2 (two) times daily.    10/19/2015 at Unknown time  . latanoprost (XALATAN) 0.005 % ophthalmic solution Place 1 drop into both eyes at bedtime.   10/19/2015 at Unknown time  . losartan (COZAAR) 25 MG tablet Take 25 mg by mouth every morning.    10/19/2015 at Unknown time  . metFORMIN (GLUCOPHAGE) 1000 MG tablet Take 1,000 mg by mouth 2 (two) times daily with a meal.   10/19/2015 at Unknown time  . Multiple Vitamin (MULTIVITAMIN WITH MINERALS) TABS tablet Take 1 tablet by mouth every other day.   10/19/2015 at Unknown time  . Omega-3 Fatty Acids (FISH OIL) 1200 MG CAPS Take 1,200 mg by mouth every morning.   10/19/2015 at Unknown time  . rosuvastatin (CRESTOR) 20 MG tablet Take 10-20 mg by mouth every evening. Alternates taking 0.5 tablet and 1 tablet daily   10/19/2015 at Unknown time    Assessment: 82 yo F  admitted 10/20/2015 found to have a probable thrombus in the distal basilar artery s/p 4 vessel cerebral arteriogram. Pharmacy consulted to dose heparin.  Hgb stable, Plt wnl. No s/sx of bleeding noted.  Goal of Therapy:  Heparin level 0.3-0.5 units/ml Monitor platelets by anticoagulation protocol: Yes   Plan:  Per MD will not bolus Start heparin infusion at 950 units/hr Check anti-Xa level in 8 hours and daily while on heparin Continue to monitor H&H and platelets  Dimitri Ped, PharmD. PGY-1 Pharmacy Resident Pager: [phone] 10/20/2015,5:44 PM

## 2015-10-20 NOTE — Consult Note (Signed)
Called that BP is 0000000 systolic while pt in need of higher SBP 2/2 recent basilar occlusion.  Has no hx of heart failure.  No echo in system.  Gave 1L NaCl bolus.

## 2015-10-20 NOTE — ED Provider Notes (Signed)
CSN: [id number]     Arrival date & time 10/20/15  [id number] History   First MD Initiated Contact with Patient 10/20/15 [phone]     No chief complaint on file.    (Consider location/radiation/quality/duration/timing/severity/associated sxs/prior Treatment) HPI Comments: Patients with history of CABG, pacemaker, aortic valve replacement, previous stroke -- presents with complaint of dizziness and vomiting. Patient awoke this morning at approximately 7 AM. Shortly afterwards she sat up and felt intensely dizzy, described as a spinning sensation. She stood up to walk to the bathroom and fell into a wall and was able to situate herself on the bed. She felt weak all over and slid down to the floor. She called her husband states that she was having trouble getting words out despite being acutely aware of everything that was happening. Patient has been vomiting. EMS was called for transport. EMS did not detect any unilateral weakness, facial droop, slurred speech. Her speech is slow but she is not aphasic. The onset of this condition was acute. The course is constant. Aggravating factors: none. Alleviating factors: none.    The history is provided by the patient, medical records and the EMS personnel.    Past Medical History  Diagnosis Date  . H/O aortic valve replacement   . Inguinal hernia   . Hyperlipidemia   . Diabetes mellitus without complication   . Glaucoma   . Hypertension   . Allergy to pollen   . Pacemaker   . Coronary artery disease   . Asthma   . Constipation   . Arthritis   . Stroke 2010ish    no residual  . Glaucoma    Past Surgical History  Procedure Laterality Date  . Hernia repair      umbilical hernia x2  . Tonsillectomy    . Exploration post operative open heart    . Coronary artery bypass graft  2006  . Aortic valve replacement  2006  . Eye surgery Bilateral     cataract  . Inguinal hernia repair Right 12/21/2013    Procedure: HERNIA REPAIR INGUINAL ADULT;  Surgeon:  Odis Hollingshead, MD;  Location: [place];  Service: General;  Laterality: Right;  . Insertion of mesh Right 12/21/2013    Procedure: INSERTION OF MESH;  Surgeon: Odis Hollingshead, MD;  Location: [place];  Service: General;  Laterality: Right;   No family history on file. Social History  Substance Use Topics  . Smoking status: Never Smoker   . Smokeless tobacco: Never Used  . Alcohol Use: No   OB History    No data available     Review of Systems  Constitutional: Negative for fever.  HENT: Negative for rhinorrhea and sore throat.   Eyes: Negative for redness.  Respiratory: Negative for cough.   Cardiovascular: Negative for chest pain.  Gastrointestinal: Positive for nausea and vomiting. Negative for abdominal pain and diarrhea.  Genitourinary: Negative for dysuria.  Musculoskeletal: Negative for myalgias.  Skin: Negative for rash.  Neurological: Positive for dizziness, speech difficulty and weakness. Negative for headaches.      Allergies  Other  Home Medications   Prior to Admission medications   Medication Sig Start Date End Date Taking? Authorizing Provider  ACCU-CHEK FASTCLIX LANCETS MISC by Does not apply route.    Historical Provider, MD  albuterol (PROVENTIL HFA;VENTOLIN HFA) 108 (90 BASE) MCG/ACT inhaler Inhale 2 puffs into the lungs every 4 (four) hours as needed for shortness of breath (cough).    Historical Provider, MD  alendronate (FOSAMAX) 70 MG tablet Take 70 mg by mouth every Monday. Take with a full glass of water on an empty stomach.    Historical Provider, MD  aspirin EC 81 MG tablet Take 162 mg by mouth every morning.     Historical Provider, MD  budesonide-formoterol (SYMBICORT) 160-4.5 MCG/ACT inhaler Inhale 2 puffs into the lungs 2 (two) times daily.    Historical Provider, MD  Calcium Citrate-Vitamin D (CALCIUM CITRATE + D PO) Take 1-2 tablets by mouth See admin instructions. Day 1 - 1 tablets once daily, day 2 - 1 tablet twice daily, repeat    Historical  Provider, MD  cholecalciferol (VITAMIN D) 1000 UNITS tablet Take 1,000 Units by mouth at bedtime. Two tablets    Historical Provider, MD  Cholecalciferol (VITAMIN D) 2000 UNITS CAPS Take 2,000 Units by mouth every morning.    Historical Provider, MD  docusate sodium (COLACE) 100 MG capsule Take 100 mg by mouth 2 (two) times daily.    Historical Provider, MD  dorzolamide-timolol (COSOPT) 22.3-6.8 MG/ML ophthalmic solution Place 1 drop into both eyes 2 (two) times daily.    Historical Provider, MD  Glucos-Chondroit-Hyaluron-MSM (GLUCOSAMINE CHONDROITIN JOINT PO) Take 1 tablet by mouth 2 (two) times daily.     Historical Provider, MD  latanoprost (XALATAN) 0.005 % ophthalmic solution Place 1 drop into both eyes at bedtime.    Historical Provider, MD  loratadine (CLARITIN) 10 MG tablet Take 10 mg by mouth daily.    Historical Provider, MD  losartan (COZAAR) 25 MG tablet Take 25 mg by mouth every morning.     Historical Provider, MD  metFORMIN (GLUCOPHAGE) 1000 MG tablet Take 1,000 mg by mouth 2 (two) times daily with a meal.    Historical Provider, MD  Multiple Vitamin (MULTIVITAMIN WITH MINERALS) TABS tablet Take 1 tablet by mouth every other day.    Historical Provider, MD  Omega-3 Fatty Acids (FISH OIL) 1200 MG CAPS Take 1,200 mg by mouth every morning.    Historical Provider, MD  rosuvastatin (CRESTOR) 20 MG tablet Take 10-20 mg by mouth every evening. Alternates taking 0.5 tablet and 1 tablet daily    Historical Provider, MD   BP 154/75 mmHg  Pulse 73  Temp(Src) 97.2 F (36.2 C) (Oral)  Resp 20  SpO2 100%   Physical Exam  Constitutional: She is oriented to person, place, and time. She appears well-developed and well-nourished.  Patient actively vomiting during exam.   HENT:  Head: Normocephalic and atraumatic.  Right Ear: Tympanic membrane, external ear and ear canal normal.  Left Ear: Tympanic membrane, external ear and ear canal normal.  Nose: Nose normal.  Mouth/Throat: Uvula is  midline, oropharynx is clear and moist and mucous membranes are normal.  Eyes: Conjunctivae, EOM and lids are normal. Pupils are equal, round, and reactive to light. Right eye exhibits no nystagmus. Left eye exhibits no nystagmus.  Neck: Normal range of motion. Neck supple.  Cardiovascular: Normal rate and regular rhythm.   No murmur heard. Pulmonary/Chest: Effort normal and breath sounds normal. No respiratory distress. She has no wheezes. She has no rales.  Abdominal: Soft. There is no tenderness.  Musculoskeletal:       Cervical back: She exhibits normal range of motion, no tenderness and no bony tenderness.  Neurological: She is alert and oriented to person, place, and time. She has normal strength and normal reflexes. No cranial nerve deficit or sensory deficit. She displays a negative Romberg sign. Coordination normal. GCS eye subscore is  4. GCS verbal subscore is 5. GCS motor subscore is 6.  Speech is slow but clear, no facial droop.   Skin: Skin is warm and dry.  Psychiatric: She has a normal mood and affect.  Nursing note and vitals reviewed.   ED Course  Procedures (including critical care time) Labs Review Labs Reviewed  COMPREHENSIVE METABOLIC PANEL - Abnormal; Notable for the following:    Glucose, Bld 265 (*)    BUN 21 (*)    Total Protein 6.0 (*)    GFR calc non Af Amer 57 (*)    All other components within normal limits  ETHANOL  PROTIME-INR  APTT  CBC  DIFFERENTIAL  URINE RAPID DRUG SCREEN, HOSP PERFORMED  URINALYSIS, ROUTINE W REFLEX MICROSCOPIC (NOT AT [facility])  I-STAT TROPOININ, ED    Imaging Review Ct Head Wo Contrast  10/20/2015  CLINICAL DATA:  PT. WOKE THIS AM WITH ROOM SPINNING, NAUSEA, DYSPHASIA WHICH HAS RESOLVED, VOMITING DURING TRANSPORT TO CT, LW EXAM: CT HEAD WITHOUT CONTRAST TECHNIQUE: Contiguous axial images were obtained from the base of the skull through the vertex without intravenous contrast. COMPARISON:  None. FINDINGS: There is mild central  cortical atrophy. There is no intra or extra-axial fluid collection or mass lesion. The basilar cisterns and ventricles have a normal appearance. There is no CT evidence for acute infarction or hemorrhage. Bone windows are unremarkable. IMPRESSION: 1. Atrophy. 2.  No evidence for acute intracranial abnormality. Electronically Signed   By: Nolon Nations M.D.   On: 10/20/2015 10:32   I have personally reviewed and evaluated these images and lab results as part of my medical decision-making.   EKG Interpretation   Date/Time:  Sunday October 20 2015 10:15:59 EDT Ventricular Rate:  83 PR Interval:  284 QRS Duration: 151 QT Interval:  437 QTC Calculation: 513 R Axis:   -18 Text Interpretation:  Sinus rhythm Ventricular premature complex Prolonged  PR interval Left bundle branch block Artifact No previous ECGs available  Confirmed by Wyvonnia Dusky  MD, STEPHEN ([id number]) on 10/20/2015 10:25:41 AM       9:33 AM Patient seen and examined. Work-up initiated. Medications ordered. No code stroke given no focal symptoms at this time. Pt actively vomiting. No aphasia. No unilateral weakness, slurred speech, or facial droop. Will complete more detailed neuro exam when vomiting is controlled.   Vital signs reviewed and are as follows: There were no vitals taken for this visit.  9:38 AM Patient discussed with Dr. Wyvonnia Dusky. Agrees no code stroke given exam at this time.   10:22 AM Dr. Wyvonnia Dusky has spoken with family who is now at bedside. They state that speech is slurred and she was seen normal by them at 7:45am. Code stroke called.   11:01 AM Neuro reccs CTA head and neck, hospitalist admit.   12:17 PM Spoke with Dyanne Carrel NP, she will see patient.   1:17 PM Posterior circulation stroke identified on CTA. Neuro aware.   MDM   Final diagnoses:  Transient cerebral ischemia, unspecified transient cerebral ischemia type   Admit.     Carlisle Cater, PA-C 10/20/15 [place], PA-C 10/20/15  [place], MD 10/20/15 2017

## 2015-10-20 NOTE — Progress Notes (Signed)
PHARMACIST - PHYSICIAN ORDER COMMUNICATION  CONCERNING: P&T Medication Policy on Herbal Medications  DESCRIPTION:  This patient's order for: glucosamine and chondroitin  has been noted.  This product(s) is classified as an "herbal" or natural product. Due to a lack of definitive safety studies or FDA approval, nonstandard manufacturing practices, plus the potential risk of unknown drug-drug interactions while on inpatient medications, the Pharmacy and Therapeutics Committee does not permit the use of "herbal" or natural products of this type within [facility].   ACTION TAKEN: The pharmacy department is unable to verify this order at this time and your patient has been informed of this safety policy. Please reevaluate patient's clinical condition at discharge and address if the herbal or natural product(s) should be resumed at that time.   Governor Specking, PharmD Clinical Pharmacy Resident Pager: [phone]

## 2015-10-20 NOTE — Consult Note (Signed)
Requesting Physician: Dr.  Wyvonnia Dusky    Reason for consultation: Evaluate for stroke  HPI:                                                                                                                                         CAMIA SCHAEDLER is an 82 y.o. female patient who presented to the emergency room with symptoms of dizziness and gait problems. She went to bed at 2:30 AM which is her last known normal. After she woke up this morning she walked she feet and felt dizzy and unstable. She reported falling more to the right side at that time. She'll brought into the emergency room by her daughter for further evaluation. Patient denies any vision or speech problems. There is some concern for possible slurred speech initially but patient is not sure. Denies any focal weakness in upper or lower extremities no new sensory problems.  She has past medical history of A. fibrillation diagnosis in the past and was on anticoagulation prior to 2011 for a couple of years. She discontinued due to her own preference and has been on aspirin 81 mg daily since then. She also has pacemaker, and I did call replacement history, history of stroke in 2010 with some right-sided weakness which has resolved.   Date last known well:  03/19 /2017 Time last known well:  2.30 am tPA Given: No: Outside tPA window.   Stroke Risk Factors - atrial fibrillation, hyperlipidemia and hypertension  Past Medical History: Past Medical History  Diagnosis Date  . H/O aortic valve replacement   . Inguinal hernia   . Hyperlipidemia   . Diabetes mellitus without complication ([place])   . Glaucoma   . Hypertension   . Allergy to pollen   . Pacemaker   . Coronary artery disease   . Asthma   . Constipation   . Arthritis   . Stroke [facility]) 2010ish    no residual  . Glaucoma     Past Surgical History  Procedure Laterality Date  . Hernia repair      umbilical hernia x2  . Tonsillectomy    . Exploration post operative open heart     . Coronary artery bypass graft  2006  . Aortic valve replacement  2006  . Eye surgery Bilateral     cataract  . Inguinal hernia repair Right 12/21/2013    Procedure: HERNIA REPAIR INGUINAL ADULT;  Surgeon: Odis Hollingshead, MD;  Location: [place];  Service: General;  Laterality: Right;  . Insertion of mesh Right 12/21/2013    Procedure: INSERTION OF MESH;  Surgeon: Odis Hollingshead, MD;  Location: [place];  Service: General;  Laterality: Right;    Family History: History reviewed. No pertinent family history.  Social History:   reports that she has never smoked. She has never used smokeless tobacco. She reports that she does not drink alcohol or  use illicit drugs.  Allergies:  Allergies  Allergen Reactions  . Other     Eye drop for glaucoma cannot remember name - gave her palpitations     Medications:                                                                                                                         Current facility-administered medications:  .  famotidine (PEPCID) IVPB 20 mg premix, 20 mg, Intravenous, Q12H, Anvith Mauriello Fuller Mandril, MD .  insulin aspart (novoLOG) injection 0-24 Units, 0-24 Units, Subcutaneous, 6 times per day, Annalisa Colonna Fuller Mandril, MD .  methylPREDNISolone sodium succinate (SOLU-MEDROL) 125 mg/2 mL injection 80 mg, 80 mg, Intravenous, Q6H, Lylia Karn Fuller Mandril, MD  Current outpatient prescriptions:  .  ACCU-CHEK FASTCLIX LANCETS MISC, by Does not apply route., Disp: , Rfl:  .  albuterol (PROVENTIL HFA;VENTOLIN HFA) 108 (90 BASE) MCG/ACT inhaler, Inhale 2 puffs into the lungs every 4 (four) hours as needed for shortness of breath (cough)., Disp: , Rfl:  .  albuterol (PROVENTIL) (2.5 MG/3ML) 0.083% nebulizer solution, Take 2.5 mg by nebulization every 6 (six) hours as needed for wheezing or shortness of breath., Disp: , Rfl:  .  alendronate (FOSAMAX) 70 MG tablet, Take 70 mg by mouth every Monday. Take with a full glass of water  on an empty stomach., Disp: , Rfl:  .  aspirin EC 81 MG tablet, Take 162 mg by mouth every morning. , Disp: , Rfl:  .  Cholecalciferol (VITAMIN D) 2000 UNITS CAPS, Take 2,000 Units by mouth every morning., Disp: , Rfl:  .  docusate sodium (COLACE) 100 MG capsule, Take 100 mg by mouth 2 (two) times daily., Disp: , Rfl:  .  dorzolamide-timolol (COSOPT) 22.3-6.8 MG/ML ophthalmic solution, Place 1 drop into both eyes 2 (two) times daily., Disp: , Rfl:  .  Glucos-Chondroit-Hyaluron-MSM (GLUCOSAMINE CHONDROITIN JOINT PO), Take 1 tablet by mouth 2 (two) times daily. , Disp: , Rfl:  .  latanoprost (XALATAN) 0.005 % ophthalmic solution, Place 1 drop into both eyes at bedtime., Disp: , Rfl:  .  losartan (COZAAR) 25 MG tablet, Take 25 mg by mouth every morning. , Disp: , Rfl:  .  metFORMIN (GLUCOPHAGE) 1000 MG tablet, Take 1,000 mg by mouth 2 (two) times daily with a meal., Disp: , Rfl:  .  Multiple Vitamin (MULTIVITAMIN WITH MINERALS) TABS tablet, Take 1 tablet by mouth every other day., Disp: , Rfl:  .  Omega-3 Fatty Acids (FISH OIL) 1200 MG CAPS, Take 1,200 mg by mouth every morning., Disp: , Rfl:  .  rosuvastatin (CRESTOR) 20 MG tablet, Take 10-20 mg by mouth every evening. Alternates taking 0.5 tablet and 1 tablet daily, Disp: , Rfl:    ROS:  History obtained from the patient  General ROS: negative for - chills, fatigue, fever, night sweats, weight gain or weight loss Psychological ROS: negative for - behavioral disorder, hallucinations, memory difficulties, mood swings or suicidal ideation Ophthalmic ROS: negative for - blurry vision, double vision, eye pain or loss of vision ENT ROS: negative for - epistaxis, nasal discharge, oral lesions, sore throat, tinnitus or vertigo Allergy and Immunology ROS: negative for - hives or itchy/watery eyes Hematological and Lymphatic  ROS: negative for - bleeding problems, bruising or swollen lymph nodes Endocrine ROS: negative for - galactorrhea, hair pattern changes, polydipsia/polyuria or temperature intolerance Respiratory ROS: negative for - cough, hemoptysis, shortness of breath or wheezing Cardiovascular ROS: negative for - chest pain, dyspnea on exertion, edema or irregular heartbeat Gastrointestinal ROS: negative for - abdominal pain, diarrhea, hematemesis, nausea/vomiting or stool incontinence Genito-Urinary ROS: negative for - dysuria, hematuria, incontinence or urinary frequency/urgency Musculoskeletal ROS: negative for - joint swelling or muscular weakness Neurological ROS: as noted in HPI Dermatological ROS: negative for rash and skin lesion changes  Neurologic Examination:                                                                                                    Today's Vitals   10/20/15 1014 10/20/15 1022  BP: 165/73 154/75  Pulse: 54 73  Temp:  97.2 F (36.2 C)  TempSrc:  Oral  Resp: 16 20  SpO2: 95% 100%    Evaluation of higher integrative functions including: Level of alertness: Alert,  Oriented to time, place and person peech: fluent, no evidence of dysarthria or aphasia noted.  Test the following cranial nerves: 2-12 grossly intact Motor examination: Normal tone, bulk, full 5/5 motor strength in all 4 extremities Examination of sensation : Normal and symmetric sensation to pinprick in all 4 extremities and on face Examination of deep tendon reflexes: 2+, normal and symmetric in all extremities, normal plantars bilaterally Test coordination: Normal finger nose testing, with no evidence of limb appendicular ataxia or abnormal involuntary movements or tremors noted.   NIHSS 0  Lab Results: Basic Metabolic Panel: No results for input(s): NA, K, CL, CO2, GLUCOSE, BUN, CREATININE, CALCIUM, MG, PHOS in the last 168 hours.  Liver Function Tests: No results for input(s): AST, ALT,  ALKPHOS, BILITOT, PROT, ALBUMIN in the last 168 hours. No results for input(s): LIPASE, AMYLASE in the last 168 hours. No results for input(s): AMMONIA in the last 168 hours.  CBC: No results for input(s): WBC, NEUTROABS, HGB, HCT, MCV, PLT in the last 168 hours.  Cardiac Enzymes: No results for input(s): CKTOTAL, CKMB, CKMBINDEX, TROPONINI in the last 168 hours.  Lipid Panel: No results for input(s): CHOL, TRIG, HDL, CHOLHDL, VLDL, LDLCALC in the last 168 hours.  CBG: No results for input(s): GLUCAP in the last 168 hours.  Microbiology: No results found for this or any previous visit.   Imaging: Ct Head Wo Contrast  10/20/2015  CLINICAL DATA:  PT. WOKE THIS AM WITH ROOM SPINNING, NAUSEA, DYSPHASIA WHICH HAS RESOLVED, VOMITING DURING TRANSPORT TO CT, LW EXAM: CT HEAD WITHOUT CONTRAST  TECHNIQUE: Contiguous axial images were obtained from the base of the skull through the vertex without intravenous contrast. COMPARISON:  None. FINDINGS: There is mild central cortical atrophy. There is no intra or extra-axial fluid collection or mass lesion. The basilar cisterns and ventricles have a normal appearance. There is no CT evidence for acute infarction or hemorrhage. Bone windows are unremarkable. IMPRESSION: 1. Atrophy. 2.  No evidence for acute intracranial abnormality. Electronically Signed   By: Nolon Nations M.D.   On: 10/20/2015 10:32      Assessment and plan:   SARY FISHBACK is an 82 y.o. female patient   who presented to the emergency room with symptoms of dizziness and gait problems. She went to bed at 2:30 AM which is her last known normal. After she woke up this morning she walked she feet and felt dizzy and unstable. She reported falling more to the right side at that time. She'll brought into the emergency room by her daughter for further evaluation. Patient denies any vision or speech problems. There is some concern for possible slurred speech initially but patient is not sure.  Denies any focal weakness in upper or lower extremities no new sensory problems.  She has past medical history of A. fibrillation diagnosis in the past and was on anticoagulation prior to 2011 for a couple of years. She discontinued due to her own preference and has been on aspirin 81 mg daily since then. She also has pacemaker, and I did call replacement history, history of stroke in 2010 with some right-sided weakness which has resolved.  Her initial neurological examination the ER is nonfocal with no definite clinical evidence for an stroke acute stroke. Moreover her last known normal was at 2:30 AM which was outside IV TPA window . But given her significant symptoms, recommend further neurodiagnostic evaluation with a CT angiogram of the head and neck.  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 There was a mention of possible stroke in the left cerebellum on the CT angiographic report although on my review this is likely to be an artifact versus infarct. Clinically patient does not have any definite evidence for left cerebellar infarct. Unfortunately MRI could not be done due to pacemaker.  Due to the basilar artery thrombus noted, she was taken for IR arteriogram for further diagnostic evaluation. It showed Non occlusive filling defect,probable thrombus, in the distal basilar artery with patency of thalamoperforaters,and PComs. ccluded RT VA at C5-6.  As the major intracranial vessels including bilateral PICA's, PCAs, AICA and SCA's , and thalamic perforators all appear to show patency, and receive adequate blood supply, with the risk of distal embolization with any manipulation of the possible basilar tip thrombus, no further thrombectomy procedure was performed following the diagnostic arteriogram.  Recommend anti-coagulation IV heparin, to be started without a bolus and goals heparin level of 0.3-0.5 units per mL.  Pharmacy consult is been placed for management of heparin infusion. Discontinue aspirin and Plavix after starting heparin. Start IV fluids to allow permissive hypertension for adequate perfusion.  Recommend repeat CT of the head tomorrow to to further evaluate the questionable left cerebellar infarct and also to rule out any hemorrhage with heparin infusion.  Reviewed her CT and CTA images with the patient, and her daughter, explained the findings. Answered several of their questions. Patient's husband was available at bedside, but he is known to have significant cognitive impairment.   Neurology  stroke team will continue to follow starting tomorrow morning.

## 2015-10-20 NOTE — Transfer of Care (Signed)
Immediate Anesthesia Transfer of Care Note  Patient: Evelyn Collins  Procedure(s) Performed: Procedure(s): RADIOLOGY WITH ANESTHESIA (N/A)  Patient Location: ICU  Anesthesia Type:MAC  Level of Consciousness: awake and alert   Airway & Oxygen Therapy: Patient Spontanous Breathing  Post-op Assessment: Report given to RN and Post -op Vital signs reviewed and stable  Post vital signs: Reviewed and stable  Last Vitals:  Filed Vitals:   10/20/15 1330 10/20/15 1400  BP: 114/76 94/81  Pulse: 74 74  Temp:    Resp: 14 18    Complications: No apparent anesthesia complications

## 2015-10-20 NOTE — Procedures (Signed)
S/P 4 vessel cerebral arteriogram. RT CFA approach. Findings. 1.Non occlusive filling defect,probable thrombus, in the distal basilar artery with patency of  thalamoperforaters,and PComs. 2.Occluded RT VA at C5-6.

## 2015-10-21 ENCOUNTER — Inpatient Hospital Stay (HOSPITAL_COMMUNITY): Payer: Medicare Other

## 2015-10-21 ENCOUNTER — Encounter (HOSPITAL_COMMUNITY): Payer: Self-pay | Admitting: Interventional Radiology

## 2015-10-21 DIAGNOSIS — I651 Occlusion and stenosis of basilar artery: Secondary | ICD-10-CM

## 2015-10-21 DIAGNOSIS — I6330 Cerebral infarction due to thrombosis of unspecified cerebral artery: Secondary | ICD-10-CM | POA: Insufficient documentation

## 2015-10-21 DIAGNOSIS — R2990 Unspecified symptoms and signs involving the nervous system: Secondary | ICD-10-CM

## 2015-10-21 DIAGNOSIS — I6789 Other cerebrovascular disease: Secondary | ICD-10-CM

## 2015-10-21 LAB — ECHOCARDIOGRAM COMPLETE
Height: 67 in
Weight: 2617.3 oz

## 2015-10-21 LAB — CBC
HCT: 36.4 % (ref 36.0–46.0)
Hemoglobin: 11.4 g/dL — ABNORMAL LOW (ref 12.0–15.0)
MCH: 26.9 pg (ref 26.0–34.0)
MCHC: 31.3 g/dL (ref 30.0–36.0)
MCV: 85.8 fL (ref 78.0–100.0)
Platelets: 164 10*3/uL (ref 150–400)
RBC: 4.24 MIL/uL (ref 3.87–5.11)
RDW: 13.7 % (ref 11.5–15.5)
WBC: 9.3 10*3/uL (ref 4.0–10.5)

## 2015-10-21 LAB — GLUCOSE, CAPILLARY
GLUCOSE-CAPILLARY: 150 mg/dL — AB (ref 65–99)
GLUCOSE-CAPILLARY: 143 mg/dL — AB (ref 65–99)
GLUCOSE-CAPILLARY: 111 mg/dL — AB (ref 65–99)
Glucose-Capillary: 181 mg/dL — ABNORMAL HIGH (ref 65–99)
Glucose-Capillary: 186 mg/dL — ABNORMAL HIGH (ref 65–99)
Glucose-Capillary: 219 mg/dL — ABNORMAL HIGH (ref 65–99)

## 2015-10-21 LAB — HEPARIN LEVEL (UNFRACTIONATED): Heparin Unfractionated: 0.21 IU/mL — ABNORMAL LOW (ref 0.30–0.70)

## 2015-10-21 LAB — HEMOGLOBIN A1C
HEMOGLOBIN A1C: 6.8 % — AB (ref 4.8–5.6)
Mean Plasma Glucose: 148 mg/dL

## 2015-10-21 LAB — LIPID PANEL
Cholesterol: 119 mg/dL (ref 0–200)
HDL: 45 mg/dL (ref >40)
LDL Cholesterol: 65 mg/dL (ref 0–99)
Total CHOL/HDL Ratio: 2.6 RATIO
Triglycerides: 44 mg/dL (ref <150)
VLDL: 9 mg/dL (ref 0–40)

## 2015-10-21 MED ORDER — APIXABAN 5 MG PO TABS
5.0000 mg | ORAL_TABLET | Freq: Two times a day (BID) | ORAL | Status: DC
  Administered 2015-10-21 – 2015-10-22 (×3): 5 mg via ORAL
  Filled 2015-10-21 (×4): qty 1

## 2015-10-21 NOTE — Evaluation (Signed)
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  pt s/p Cerebral Angiogram.  pt with hx of CABG, Pacemaker, Glucoma, AVR, CVA, HTN, DM, Asthma, and CAD.     Clinical Impression   Pt admitted with above. She demonstrates the below listed deficits and will benefit from continued OT to maximize safety and independence with BADLs.  Pt presents to OT with generalized weakness, mild balance deficits, and complaints of dizziness with head turns.  She reports feeeling foggy.  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  Currently, she requires min guard assist.  Anticipate she will quickly progress to mod I with ADLs, but may require intermittent assist with IADLs at discharge.  Will follow acutely.       Follow Up Recommendations  No OT follow up;Supervision - Intermittent    Equipment Recommendations  Tub/shower seat    Recommendations for Other Services       Precautions / Restrictions Precautions Precautions: Fall Precaution Comments: Watch BP Restrictions Weight Bearing Restrictions: No      Mobility Bed Mobility Overal bed mobility: Needs Assistance Bed Mobility: Supine to Sit     Supine to sit: Supervision        Transfers Overall transfer level: Needs assistance Equipment used: None Transfers: Sit to/from Stand;Stand Pivot Transfers Sit to Stand: Supervision Stand pivot transfers: Supervision       General transfer comment: pt moves very slowly and with definite use of UEs.  pt indicates being nervous.      Balance Overall balance assessment: Needs assistance Sitting-balance support: Feet supported Sitting balance-Leahy Scale: Good     Standing balance support: No upper extremity supported Standing balance-Leahy Scale: Fair Standing balance  comment: Pt with mild unsteadiness wihen concentrating and performing cognitive tasks                             ADL Overall ADL's : Needs assistance/impaired Eating/Feeding: Independent   Grooming: Wash/dry hands;Wash/dry face;Oral care;Brushing hair;Min guard;Standing   Upper Body Bathing: Set up;Sitting   Lower Body Bathing: Min guard;Sit to/from stand   Upper Body Dressing : Set up;Sitting   Lower Body Dressing: Min guard;Sit to/from stand   Toilet Transfer: Min guard;Ambulation;Comfort height toilet;RW   Toileting- Water quality scientist and Hygiene: Min guard;Sit to/from stand       Functional mobility during ADLs: Min guard General ADL Comments: Pt reports she feels foggy.  She moves tentatively and cautiously.   She was able to count backwards by 2's from 100's without error      Vision Vision Assessment?: Yes Eye Alignment: Within Functional Limits Ocular Range of Motion: Within Functional Limits Visual Fields: No apparent deficits Additional Comments: Pt reports mild blurriness when she looks to far Lt and far Rt.  Questionable mild horizontal nystamus with horizontal gaze.  She does report mild dizziness with head turns and nods.   Perception Perception Perception Tested?: Yes   Praxis Praxis Praxis tested?: Within functional limits    Pertinent Vitals/Pain Pain Assessment: No/denies pain     Hand Dominance Right   Extremity/Trunk Assessment Upper Extremity Assessment Upper Extremity Assessment: Overall WFL for tasks assessed   Lower Extremity Assessment Lower Extremity Assessment: Defer to PT evaluation   Cervical / Trunk Assessment Cervical / Trunk Assessment: Normal  Communication Communication Communication: No difficulties   Cognition Arousal/Alertness: Awake/alert Behavior During Therapy: WFL for tasks assessed/performed Overall Cognitive Status: Within Functional Limits for tasks assessed                     General  Comments       Exercises       Shoulder Instructions      Home Living Family/patient expects to be discharged to:: Private residence Living Arrangements: Spouse/significant other Available Help at Discharge: Family Type of Home: House Home Access: Stairs to enter Technical brewer of Steps: "few"   Home Layout: One level     Bathroom Shower/Tub: Occupational psychologist: Standard     Home Equipment: None   Additional Comments: pt's husband is home with her, but he has Dementia and is unable to provide A unless pt is able to direct him through task.        Prior Functioning/Environment Level of Independence: Independent        Comments: Pt is caregiver for spouse who has dementia.  She provides supervision and performs all IADLs     OT Diagnosis: Generalized weakness   OT Problem List: Decreased strength;Decreased activity tolerance;Impaired balance (sitting and/or standing)   OT Treatment/Interventions: Self-care/ADL training;DME and/or AE instruction;Therapeutic activities;Patient/family education;Balance training    OT Goals(Current goals can be found in the care plan section) Acute Rehab OT Goals Patient Stated Goal: Back to normal. OT Goal Formulation: With patient Time For Goal Achievement: 10/28/15 Potential to Achieve Goals: Good ADL Goals Pt Will Perform Grooming: with modified independence;standing Pt Will Perform Upper Body Bathing: with modified independence;sitting;standing Pt Will Perform Lower Body Bathing: with modified independence;sit to/from stand Pt Will Perform Upper Body Dressing: with modified independence;sitting;standing Pt Will Perform Lower Body Dressing: with modified independence;sit to/from stand Pt Will Transfer to Toilet: with modified independence;ambulating;regular height toilet;bedside commode;grab bars Pt Will Perform Toileting - Clothing Manipulation and hygiene: with modified independence;sit to/from stand Pt  Will Perform Tub/Shower Transfer: with supervision;Shower transfer;ambulating;shower seat Additional ADL Goal #1: Pt will peform path finding with supervision   OT Frequency: Min 2X/week   Barriers to D/C: Decreased caregiver support          Co-evaluation              End of Session Nurse Communication: Mobility status  Activity Tolerance: Patient tolerated treatment well Patient left: in chair;with call bell/phone within reach;with family/visitor present   Time: 1139-1202 OT Time Calculation (min): 23 min Charges:  OT General Charges $OT Visit: 1 Procedure OT Evaluation $OT Eval Moderate Complexity: 1 Procedure OT Treatments $Self Care/Home Management : 8-22 mins G-Codes:    [place] Flett M 15-Nov-2015, 1:11 PM

## 2015-10-21 NOTE — Progress Notes (Signed)
STROKE TEAM PROGRESS NOTE   HISTORY OF PRESENT ILLNESS Evelyn Collins is an 82 y.o. female patient who presented to the emergency room with symptoms of dizziness and gait problems. She went to bed at 2:30 AM which is her last known normal. After she woke up this morning she walked she feet and felt dizzy and unstable. She reported falling more to the right side at that time. She'll brought into the emergency room by her daughter for further evaluation. Patient denies any vision or speech problems. There is some concern for possible slurred speech initially but patient is not sure. Denies any focal weakness in upper or lower extremities no new sensory problems.  She has past medical history of A. fibrillation diagnosis in the past and was on anticoagulation prior to 2011 for a couple of years. She discontinued due to her own preference and has been on aspirin 81 mg daily since then. She also has pacemaker, and I did call replacement history, history of stroke in 2010 with some right-sided weakness which has resolved.   Date last known well: 03/19 /2017 Time last known well: 2.30 am tPA Given: No: Outside tPA window.       SUBJECTIVE (INTERVAL HISTORY) Her  RN is at the bedside.  Overall she feels her condition is rapidly improving. Her blood pressure is adequately controlled. She has never been on anticoagulation and was on aspirin for her atrial fibrillation. She is not a fall risk and is agreeable to long-term anticoagulation.    OBJECTIVE Temp:  [95.8 F (35.4 C)-98.5 F (36.9 C)] 97.9 F (36.6 C) (03/20 0400) Pulse Rate:  [31-84] 68 (03/20 0700) Cardiac Rhythm:  [-]  Resp:  [13-21] 13 (03/20 0700) BP: (92-165)/(43-89) 109/56 mmHg (03/20 0700) SpO2:  [93 %-100 %] 98 % (03/20 0700) Weight:  [74.2 kg (163 lb 9.3 oz)] 74.2 kg (163 lb 9.3 oz) (03/19 1624)  CBC:  Recent Labs Lab 10/20/15 1053 10/21/15 0200  WBC 9.3 9.3  NEUTROABS 7.1  --   HGB 12.0 11.4*  HCT 38.1 36.4  MCV  86.6 85.8  PLT 161 [id number]    Basic Metabolic Panel:  Recent Labs Lab 10/20/15 1053  NA 141  K 3.5  CL 104  CO2 23  GLUCOSE 265*  BUN 21*  CREATININE 0.92  CALCIUM 10.1    Lipid Panel:    Component Value Date/Time   CHOL 119 10/21/2015 0200   TRIG 44 10/21/2015 0200   HDL 45 10/21/2015 0200   CHOLHDL 2.6 10/21/2015 0200   VLDL 9 10/21/2015 0200   LDLCALC 65 10/21/2015 0200   HgbA1c: No results found for: HGBA1C Urine Drug Screen:    Component Value Date/Time   LABOPIA NONE DETECTED 10/20/2015 1335   COCAINSCRNUR NONE DETECTED 10/20/2015 1335   LABBENZ NONE DETECTED 10/20/2015 1335   AMPHETMU NONE DETECTED 10/20/2015 1335   THCU NONE DETECTED 10/20/2015 1335   LABBARB NONE DETECTED 10/20/2015 1335      IMAGING  Ct Angio Head W/cm &/or Wo Cm 10/20/2015   Early signs of acute infarct left cerebellum. Occlusion of the mid right vertebral artery which appears acute. There is embolism to the distal basilar. Superior cerebellar and posterior cerebral arteries are patent bilaterally.    Ct Angio Neck W/cm &/or Wo/cm 10/20/2015   No significant carotid stenosis. Left vertebral artery widely patent. I am currently awaiting a call back from the referring physician.    Dg Chest 2 View 10/20/2015   No edema or  consolidation.  No change in cardiac silhouette.    Ct Head Wo Contrast 10/20/2015   1. Atrophy.  2.  No evidence for acute intracranial abnormality.    Cerebral angiogram S/P 4 vessel cerebral arteriogram. RT CFA approach. Findings. 1.Non occlusive filling defect,probable thrombus, in the distal basilar artery with patency of thalamoperforaters,and PComs. 2.Occluded RT VA at C5-6.   PHYSICAL EXAM Pleasant elderly Caucasian lady sitting up comfortably in a bedside chair not in distress. . Afebrile. Head is nontraumatic. Neck is supple without bruit.    Cardiac exam no murmur or gallop. Lungs are clear to auscultation. Distal pulses are well  felt.  Neurological Exam ;  Awake  Alert oriented x 3. Normal speech and language.eye movements full without nystagmus.fundi were not visualized. Vision acuity and fields appear normal. Hearing is normal. Palatal movements are normal. Face symmetric. Tongue midline. Normal strength, tone, reflexes and coordination. Normal sensation. Gait deferred.    ASSESSMENT/PLAN [name] is a 82 y.o. female with history of history of aortic valve replacement, hyperlipidemia, diabetes mellitus, hypertension, status post permanent pacemaker, coronary artery disease, and previous stroke  presenting with dizziness, gait disturbance, possible slurred speech.. She did not receive IV t-PA due to late presentation.  Stroke:  Non-Dominant   Resultant  gait ataxia   MRI - permanent pacemaker  MRA - permanent pacemaker  Carotid Doppler - refer to CTA of the neck  Cerebral angiogram - see above  2D Echo  EF 55-60%. May need TEE to further evaluate prosthetic valve. No definite cardiac source of emboli.  LDL 65  HgbA1c pending  VTE prophylaxis - Eliquis  Diet Heart Room service appropriate?: Yes; Fluid consistency:: Thin  aspirin 81 mg daily prior to admission, now on Eliquis (apixaban) daily  Patient counseled to be compliant with her antithrombotic medications  Ongoing aggressive stroke risk factor management  Therapy recommendations: Home health physical therapy recommended  Disposition: Pending  Hypertension  Blood pressure tends to run low  Permissive hypertension (OK if < 220/120) but gradually normalize in 5-7 days  Hyperlipidemia  Home meds:  Crestor 10 mg daily resumed in hospital  LDL 65, goal < 70  Continue statin at discharge  Diabetes  HgbA1c pending, goal < 7.0  Uncontrolled  Other Stroke Risk Factors  Advanced age  Hx stroke/TIA  Coronary artery disease  Atrial fibrillation   Other Active Problems    Hospital day # 1  I have personally  examined this patient, reviewed notes, independently viewed imaging studies, participated in medical decision making and plan of care. I have made any additions or clarifications directly to the above note. Agree with note above. She presented with dizziness and gait ataxia due to multiple posterior circulation infarcts secondary to basilar tip embolism from atrial fibrillation. She remains at risk for neurological worsening, recurrent stroke, TIA and needs close neurological monitoring and anticoagulation. I had a long discussion with the patient's great results were stroke risk, stroke evaluation, secondary prevention and answered questions. Plan to consult pharmacy for starting eliquis for long-term anticoagulation and discontinuing heparin. Mobilize out of bed. Physical occupational therapy consult. Likely discharge home tomorrow if stable This patient is critically ill and at significant risk of neurological worsening, death and care requires constant monitoring of vital signs, hemodynamics,respiratory and cardiac monitoring, extensive review of multiple databases, frequent neurological assessment, discussion with family, other specialists and medical decision making of high complexity.I have made any additions or clarifications directly to the above note.This critical care  time does not reflect procedure time, or teaching time or supervisory time of PA/NP/Med Resident etc but could involve care discussion time.  I spent 30 minutes of neurocritical care time  in the care of  this patient.    Antony Contras, MD Medical Director [facility] Pager: [phone] 10/21/2015 5:17 PM     To contact Stroke Continuity provider, please refer to [email or web address] After hours, contact General Neurology

## 2015-10-21 NOTE — Progress Notes (Signed)
ANTICOAGULATION CONSULT NOTE - Follow Up Consult  Pharmacy Consult for Heparin  Indication: Basilar artery thrombus, afib  Allergies  Allergen Reactions  . Other     Eye drop for glaucoma cannot remember name - gave her palpitations    Patient Measurements: Height: 5\' 7"  (170.2 cm) Weight: 163 lb 9.3 oz (74.2 kg) IBW/kg (Calculated) : 61.6  Vital Signs: Temp: 98.2 F (36.8 C) (03/20 0800) Temp Source: Oral (03/20 0800) BP: 105/40 mmHg (03/20 1000) Pulse Rate: 69 (03/20 1000)  Labs:  Recent Labs  10/20/15 1053 10/21/15 0200  HGB 12.0 11.4*  HCT 38.1 36.4  PLT 161 164  APTT 24  --   LABPROT 12.7  --   INR 0.93  --   HEPARINUNFRC  --  0.21*  CREATININE 0.92  --     Estimated Creatinine Clearance: 50.4 mL/min (by C-G formula based on Cr of 0.92).  Assessment: 34 yof initiated on heparin IV for treatment of basilar artery thrombus now transitioning to apixaban for afib. No bleeding noted, Hgb down slightly. Pt with good renal function for her age.   Goal of Therapy:  Therapeutic anticoagulation   Plan:  - Apixaban 5mg  PO BID - verified afib dosing per Dr. Leonie Man - F/u S&S of bleeding, renal fxn  Evelyn Collins, Evelyn Collins 10/21/2015,11:21 AM

## 2015-10-21 NOTE — Progress Notes (Signed)
  Echocardiogram 2D Echocardiogram has been performed.  Evelyn Collins 10/21/2015, 9:40 AM

## 2015-10-21 NOTE — Clinical Documentation Improvement (Signed)
Internal Medicine Neurology  Can the diagnosis of Atrial Fibrillation be further specified? Please document findings in next progress note. Thank you!   Chronic Atrial fibrillation  Paroxysmal Atrial fibrillation  Permanent Atrial fibrillation  Persistent Atrial fibrillation  Other  Clinically Undetermined  Document any associated diagnoses/conditions.  Supporting Information:  "She has past medical history of A. fibrillation diagnosis in the past and was on anticoagulation prior to 2011 for a couple of years. She discontinued due to her own preference and has been on aspirin 81 mg daily since then". Neuro Consult 10/20/15   Please exercise your independent, professional judgment when responding. A specific answer is not anticipated or expected.  Thank You,  Zoila Shutter RN, BSN, [place] [phone]; Cell: [phone]

## 2015-10-21 NOTE — Progress Notes (Signed)
[facility] TEAM 1 - Stepdown/ICU TEAM PROGRESS NOTE  Evelyn Collins [id number] DOB: Dec 13, 1932 DOA: 10/20/2015 PCP: Velna Hatchet, MD  Admit HPI / Brief Narrative: 82 y.o. female with a history of CABG, pacemaker, aortic valve replacement (porcine), stroke in 2010, hypertension, diabetes, and asthma who presented to the emergency department c/o dizziness and difficulty speaking.  She said she awakened and felt very dizzy. She attempted to walk and ultimately slid to the floor where she was lying face down unable to get up. She tried to call out to her husband and was unable to do so. She was eventually able to summon EMS, who reported they did not detect any unilateral weakness facial droop or slurred speech.  In the ED she had a rectal temp of 95.8.  She was hemodynamically stable and not hypoxic.  Neurology ordered a CT ANGIO of the head and neck.  HPI/Subjective: The patient is resting comfortably in a bedside chair at this time.  She reports feeling somewhat unsteady when she attempts to ambulate.  She denies focal weakness of the arms or legs.  She denies dizziness when sitting, headache, difficulty with speech, difficulty with swallowing, shortness of breath, or chest pain.  Assessment/Plan:  Acute L cerebellar CVA - Acute occlusion R verterbral artery - Basilar artery embolism Stroke team following - felt to be related to atrial fibrillation without prophylactic anticoagulation - discussed at great length with patient the need for full anticoagulation in the very high risk of recurrent stroke without this treatment - she has agreed to continue anticoagulation after discharge  Chronic Afib  The patient had previously been prescribed Coumadin but she stopped on her own in 2011 - she was taking a baby aspirin only - she is presently rate controlled - patient has agreed to anticoagulation as discussed above  S/P AoV replacement 2006 Echocardiogram this admission raises concern for  possible high gradient across her aortic valve - the patient is well known by Dr. Tollie Eth whom I have spoken with and he states that this is a known problem for her and that she is followed at the [facility] where she had her surgery done  DM Reasonably well controlled at this time  HTN Controlled  CAD s/p CABG 2006 Denies chest pressure or dyspnea on exertion  Hx pacemaker   Code Status: FULL Family Communication: Spoke with husband and daughter at bedside at great length Disposition Plan:   Consultants: Stroke Team   Procedures: 3/19 4 vessel cerebral arteriogram - RT CFA approach:  Non occlusive filling defect, probable thrombus, in the distal basilar artery with patency ofthalamoperforaters,and PComs - Occluded RT VA at C5-6  Antibiotics: None  DVT prophylaxis: Apixaban  Objective: Blood pressure 108/66, pulse 59, temperature 98.2 F (36.8 C), temperature source Oral, resp. rate 13, height 5\' 7"  (1.702 m), weight 74.2 kg (163 lb 9.3 oz), SpO2 100 %.  Intake/Output Summary (Last 24 hours) at 10/21/15 1202 Last data filed at 10/21/15 1200  Gross per 24 hour  Intake 3002.75 ml  Output   1060 ml  Net 1942.75 ml   Exam: General: No acute respiratory distress Lungs: Clear to auscultation bilaterally without wheezes or crackles Cardiovascular: Regular rate and rhythm without murmur gallop or rub normal S1 and S2 Abdomen: Nontender, nondistended, soft, bowel sounds positive, no rebound, no ascites, no appreciable mass Extremities: No significant cyanosis, clubbing, or edema bilateral lower extremities  Data Reviewed:  Basic Metabolic Panel:  Recent Labs Lab 10/20/15 1053  NA 141  K 3.5  CL 104  CO2 23  GLUCOSE 265*  BUN 21*  CREATININE 0.92  CALCIUM 10.1    CBC:  Recent Labs Lab 10/20/15 1053 10/21/15 0200  WBC 9.3 9.3  NEUTROABS 7.1  --   HGB 12.0 11.4*  HCT 38.1 36.4  MCV 86.6 85.8  PLT 161 164    Liver Function Tests:  Recent  Labs Lab 10/20/15 1053  AST 21  ALT 16  ALKPHOS 40  BILITOT 0.6  PROT 6.0*  ALBUMIN 3.8   Coags:  Recent Labs Lab 10/20/15 1053  INR 0.93    Recent Labs Lab 10/20/15 1053  APTT 24   CBG:  Recent Labs Lab 10/20/15 2006 10/20/15 2343 10/21/15 0408 10/21/15 0832 10/21/15 1123  GLUCAP 199* 186* 181* 150* 143*    Recent Results (from the past 240 hour(s))  MRSA PCR Screening     Status: None   Collection Time: 10/20/15  4:30 PM  Result Value Ref Range Status   MRSA by PCR NEGATIVE NEGATIVE Final    Comment:        The GeneXpert MRSA Assay (FDA approved for NASAL specimens only), is one component of a comprehensive MRSA colonization surveillance program. It is not intended to diagnose MRSA infection nor to guide or monitor treatment for MRSA infections.      Studies:   Recent x-ray studies have been reviewed in detail by the Attending Physician  Scheduled Meds:  Scheduled Meds: .  stroke: mapping our early stages of recovery book   Does not apply Once  . apixaban  5 mg Oral BID  . docusate sodium  100 mg Oral BID  . dorzolamide-timolol  1 drop Both Eyes BID  . famotidine (PEPCID) IV  20 mg Intravenous Q12H  . insulin aspart  0-15 Units Subcutaneous TID WC  . insulin aspart  0-5 Units Subcutaneous QHS  . insulin aspart  4 Units Subcutaneous Once  . latanoprost  1 drop Both Eyes QHS  . rosuvastatin  10 mg Oral QPM    Time spent on care of this patient: 35 mins   Shemeca Lukasik T , MD   [facility] Office  [phone] Pager - Text Page per Shea Evans as per below:  On-Call/Text Page:      [email or web address]      password [email or web address]  If 7PM-7AM, please contact night-coverage [email or web address] Password [email or web address] 10/21/2015, 12:02 PM   LOS: 1 day

## 2015-10-21 NOTE — Progress Notes (Signed)
ANTICOAGULATION CONSULT NOTE - Follow Up Consult  Pharmacy Consult for Heparin  Indication: Basilar artery thrombus  Allergies  Allergen Reactions  . Other     Eye drop for glaucoma cannot remember name - gave her palpitations    Patient Measurements: Height: 5\' 7"  (170.2 cm) Weight: 163 lb 9.3 oz (74.2 kg) IBW/kg (Calculated) : 61.6  Vital Signs: Temp: 97.7 F (36.5 C) (03/20 0000) Temp Source: Axillary (03/20 0000) BP: 97/84 mmHg (03/20 0100) Pulse Rate: 82 (03/20 0100)  Labs:  Recent Labs  10/20/15 1053 10/21/15 0200  HGB 12.0 11.4*  HCT 38.1 36.4  PLT 161 164  APTT 24  --   LABPROT 12.7  --   INR 0.93  --   HEPARINUNFRC  --  0.21*  CREATININE 0.92  --     Estimated Creatinine Clearance: 50.4 mL/min (by C-G formula based on Cr of 0.92).    Assessment: Initial heparin level this AM is low at 0.21, no issues per RN.   Goal of Therapy:  Heparin level 0.3-0.5 units/ml Monitor platelets by anticoagulation protocol: Yes   Plan:  -NO BOLUS -Inc heparin to 1050 units/hr -1200 HL  Dewey Viens 10/21/2015,3:42 AM

## 2015-10-21 NOTE — Evaluation (Signed)
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  pt s/p Cerebral Angiogram.  pt with hx of CABG, Pacemaker, Glucoma, AVR, CVA, HTN, DM, Asthma, and CAD.    Clinical Impression  Pt nervous and admits to feeling scared about return to home.  Pt tells PT that her husband has Dementia and that he was home when she fell, but he was unable to use the phone to call for help.  Spoke with pt about Alert buttons for home and pt relates that her daughters are "working on something" and pt states "it is time for a change".  Feel pt will need reliable support at home whether through a home caregiver or pt and her husband transitioning to a Senior facility.  During eval pt's systolic BP was in [id number] supine, 120s sitting and 110s standing.  Will continue to follow.      Follow Up Recommendations Home health PT;Supervision - Intermittent    Equipment Recommendations  None recommended by PT    Recommendations for Other Services       Precautions / Restrictions Precautions Precautions: Fall Precaution Comments: Watch BP Restrictions Weight Bearing Restrictions: No      Mobility  Bed Mobility Overal bed mobility: Needs Assistance Bed Mobility: Supine to Sit     Supine to sit: Supervision        Transfers Overall transfer level: Needs assistance Equipment used: None Transfers: Sit to/from Stand Sit to Stand: Supervision         General transfer comment: pt moves very slowly and with definite use of UEs.  pt indicates being nervous.    Ambulation/Gait Ambulation/Gait assistance: Min guard Ambulation Distance (Feet): 150 Feet Assistive device: None Gait Pattern/deviations: Step-through pattern;Decreased stride length     General Gait Details: pt indicates being nervous and fearful after falling at home.  pt  with mild balance deficits and may benefit from DGI or other balance testing in future.    Stairs            Wheelchair Mobility    Modified Rankin (Stroke Patients Only) Modified Rankin (Stroke Patients Only) Pre-Morbid Rankin Score: No symptoms Modified Rankin: Moderately severe disability     Balance Overall balance assessment: Needs assistance Sitting-balance support: No upper extremity supported;Feet supported Sitting balance-Leahy Scale: Good     Standing balance support: No upper extremity supported;During functional activity Standing balance-Leahy Scale: Fair                               Pertinent Vitals/Pain Pain Assessment: No/denies pain    Home Living Family/patient expects to be discharged to:: Private residence Living Arrangements: Spouse/significant other Available Help at Discharge: Family Type of Home: House Home Access: Stairs to enter   Technical brewer of Steps: "few" Home Layout: One level Home Equipment: None Additional Comments: pt's husband is home with her, but he has Dementia and is unable to provide A unless pt is able to direct him through task.      Prior Function Level of Independence: Independent               Hand Dominance        Extremity/Trunk Assessment   Upper Extremity Assessment: Defer to OT evaluation           Lower Extremity  Assessment: Overall WFL for tasks assessed      Cervical / Trunk Assessment: Normal  Communication   Communication: No difficulties  Cognition Arousal/Alertness: Awake/alert Behavior During Therapy: WFL for tasks assessed/performed Overall Cognitive Status: Within Functional Limits for tasks assessed                      General Comments      Exercises        Assessment/Plan    PT Assessment Patient needs continued PT services  PT Diagnosis Difficulty walking   PT Problem List Decreased activity tolerance;Decreased balance;Decreased  mobility;Decreased coordination;Decreased knowledge of use of DME;Cardiopulmonary status limiting activity  PT Treatment Interventions DME instruction;Gait training;Stair training;Functional mobility training;Therapeutic activities;Therapeutic exercise;Balance training;Neuromuscular re-education;Patient/family education   PT Goals (Current goals can be found in the Care Plan section) Acute Rehab PT Goals Patient Stated Goal: Back to normal. PT Goal Formulation: With patient Time For Goal Achievement: 11/04/15 Potential to Achieve Goals: Good    Frequency Min 4X/week   Barriers to discharge Decreased caregiver support      Co-evaluation               End of Session Equipment Utilized During Treatment: Gait belt Activity Tolerance: Patient tolerated treatment well Patient left: in chair;with call bell/phone within reach Nurse Communication: Mobility status         Time: 0921-0949 PT Time Calculation (min) (ACUTE ONLY): 28 min   Charges:   PT Evaluation $PT Eval Moderate Complexity: 1 Procedure PT Treatments $Gait Training: 8-22 mins   PT G CodesCatarina Hartshorn, [place] 10/21/2015, 12:55 PM

## 2015-10-22 DIAGNOSIS — I6330 Cerebral infarction due to thrombosis of unspecified cerebral artery: Secondary | ICD-10-CM

## 2015-10-22 DIAGNOSIS — R42 Dizziness and giddiness: Secondary | ICD-10-CM

## 2015-10-22 LAB — GLUCOSE, CAPILLARY
GLUCOSE-CAPILLARY: 113 mg/dL — AB (ref 65–99)
Glucose-Capillary: 139 mg/dL — ABNORMAL HIGH (ref 65–99)
Glucose-Capillary: 111 mg/dL — ABNORMAL HIGH (ref 65–99)

## 2015-10-22 LAB — BASIC METABOLIC PANEL
ANION GAP: 7 (ref 5–15)
BUN: 14 mg/dL (ref 6–20)
CALCIUM: 8.8 mg/dL — AB (ref 8.9–10.3)
CO2: 23 mmol/L (ref 22–32)
Chloride: 112 mmol/L — ABNORMAL HIGH (ref 101–111)
Creatinine, Ser: 0.87 mg/dL (ref 0.44–1.00)
GFR calc non Af Amer: >60 mL/min (ref >60)
GLUCOSE: 145 mg/dL — AB (ref 65–99)
Potassium: 3.8 mmol/L (ref 3.5–5.1)
Sodium: 142 mmol/L (ref 135–145)

## 2015-10-22 LAB — CBC
HCT: 35.9 % — ABNORMAL LOW (ref 36.0–46.0)
Hemoglobin: 11.2 g/dL — ABNORMAL LOW (ref 12.0–15.0)
MCH: 27 pg (ref 26.0–34.0)
MCHC: 31.2 g/dL (ref 30.0–36.0)
MCV: 86.5 fL (ref 78.0–100.0)
PLATELETS: 141 10*3/uL — AB (ref 150–400)
RBC: 4.15 MIL/uL (ref 3.87–5.11)
RDW: 14 % (ref 11.5–15.5)
WBC: 9.4 10*3/uL (ref 4.0–10.5)

## 2015-10-22 LAB — HEMOGLOBIN A1C
Hgb A1c MFr Bld: 7 % — ABNORMAL HIGH (ref 4.8–5.6)
MEAN PLASMA GLUCOSE: 154 mg/dL

## 2015-10-22 MED ORDER — LOSARTAN POTASSIUM 25 MG PO TABS: 12.5000 mg | ORAL_TABLET | Freq: Every morning | ORAL | Status: AC

## 2015-10-22 MED ORDER — APIXABAN 5 MG PO TABS: 5.0000 mg | ORAL_TABLET | Freq: Two times a day (BID) | ORAL | Status: AC

## 2015-10-22 NOTE — Discharge Summary (Signed)
Physician Discharge Summary  Evelyn Collins [id number] DOB: Mar 06, 1933 DOA: 10/20/2015  PCP: Velna Hatchet, MD  Admit date: 10/20/2015 Discharge date: 10/22/2015  Time spent: 35 minutes  Recommendations for Outpatient Follow-up:  Acute L cerebellar CVA - Acute occlusion R verterbral artery - Basilar artery embolism -Stroke team following - felt to be related to atrial fibrillation without prophylactic anticoagulation  -Asymptomatic today except for waxing and waning headache, and nausea -Discharge on Eliquis 5 mg BID -Follow-up per stroke team  Chronic Afib  -The patient had previously been prescribed Coumadin but she stopped on her own in 2011, she was taking a baby aspirin only -Rate controlled not on any medication. -Follow-up at [facility]  S/P AoV replacement 2006 -Echocardiogram this admission raises concern for possible high gradient across her aortic valve - the patient is well known by Dr. Tollie Eth whom I have spoken with and he states that this is a known problem for her and that she is followed at the [facility] where she had her surgery done  DM Type 2 controlled -3/20 hemoglobin A1c= 7 -Reasonably well controlled at this time  HTN -Slightly elevated be given patient's age within new guidelines. Restart losartan at 12.5 mg daily  CAD s/p CABG 2006 -Denies chest pressure or dyspnea on exertion  Hx pacemaker     Discharge Diagnoses:  Principal Problem:   Stroke-like symptoms Active Problems:   Diabetes mellitus without complication ([place])   Hypertension   Pacemaker   Asthma   Basilar artery embolism   Cerebral thrombosis with cerebral infarction   Discharge Condition: Stable  Diet recommendation: Heart healthy/American diabetic Association  Filed Weights   10/20/15 1624  Weight: 74.2 kg (163 lb 9.3 oz)    History of present illness:  Evelyn Collins is an 82 y.o.WF PMHx CVA 2010, DM Type 2 without complications, HTN, HLD, S/P She had a prior  AVR 09/28/07 with 23 mm Mosaic valve at [facility] in [place] (porcine valve), pacemaker, CAD native artery, CABG  Who presented to the emergency room with symptoms of dizziness and gait problems. She went to bed at 2:30 AM which is her last known normal. After she woke up this morning she walked she feet and felt dizzy and unstable. She reported falling more to the right side at that time. She'll brought into the emergency room by her daughter for further evaluation. Patient denies any vision or speech problems. There is some concern for possible slurred speech initially but patient is not sure. Denies any focal weakness in upper or lower extremities no new sensory problems.  She has past medical history of A. fibrillation diagnosis in the past and was on anticoagulation prior to 2011 for a couple of years. She discontinued due to her own preference and has been on aspirin 81 mg daily since then. She also has pacemaker, and I did call replacement history, history of stroke in 2010 with some right-sided weakness which has resolved.    Procedures: 3/19 4 vessel cerebral arteriogram - RT CFA approach: Non occlusive filling defect, probable thrombus, in the distal basilar artery with patency ofthalamoperforaters,and PComs - Occluded RT VA at C5-6 3/28 echocardiogram;- Left ventricle: moderate LVH. --LVEF=55%- 60%. -(grade 1 diastolic dysfunction).  - Aortic valve: Bioprosthetic aortic valve with high gradients of  32 mmHG mean and 63 mmHg peak. AT 126 msec (abnormal), however, DVI 0.45 (not suggestive of obstruction).    Consultations: Dr.Pramod Clydene Fake Stroke Tm Dr Jacolyn Reedy cardiology  Discharge Exam: Filed Vitals:   10/22/15 1400 10/22/15 1500 10/22/15 1600 10/22/15 1700  BP: 122/63 138/67  154/72  Pulse: 73 63 64 59  Temp:   97.5 F (36.4 C)   TempSrc:   Oral   Resp: 17 16 20 18   Height:      Weight:      SpO2: 95% 95% 94% 91%    General: A/O 4, NAD, Cardiovascular:  Irregular irregular rhythm and rate, negative murmurs rubs or gallops, normal S1/S2 Respiratory: Clear to auscultation bilateral Neurology; patient ambulated around ward without assistance, negative neuro deficit   Discharge Instructions     Medication List    ASK your doctor about these medications        ACCU-CHEK FASTCLIX LANCETS Misc  by Does not apply route.     albuterol (2.5 MG/3ML) 0.083% nebulizer solution  Commonly known as:  PROVENTIL  Take 2.5 mg by nebulization every 6 (six) hours as needed for wheezing or shortness of breath.     albuterol 108 (90 Base) MCG/ACT inhaler  Commonly known as:  PROVENTIL HFA;VENTOLIN HFA  Inhale 2 puffs into the lungs every 4 (four) hours as needed for shortness of breath (cough).     alendronate 70 MG tablet  Commonly known as:  FOSAMAX  Take 70 mg by mouth every Monday. Take with a full glass of water on an empty stomach.     aspirin EC 81 MG tablet  Take 162 mg by mouth every morning.     docusate sodium 100 MG capsule  Commonly known as:  COLACE  Take 100 mg by mouth 2 (two) times daily.     dorzolamide-timolol 22.3-6.8 MG/ML ophthalmic solution  Commonly known as:  COSOPT  Place 1 drop into both eyes 2 (two) times daily.     Fish Oil 1200 MG Caps  Take 1,200 mg by mouth every morning.     GLUCOSAMINE CHONDROITIN JOINT PO  Take 1 tablet by mouth 2 (two) times daily.     latanoprost 0.005 % ophthalmic solution  Commonly known as:  XALATAN  Place 1 drop into both eyes at bedtime.     losartan 25 MG tablet  Commonly known as:  COZAAR  Take 25 mg by mouth every morning.     metFORMIN 1000 MG tablet  Commonly known as:  GLUCOPHAGE  Take 1,000 mg by mouth 2 (two) times daily with a meal.     multivitamin with minerals Tabs tablet  Take 1 tablet by mouth every other day.     rosuvastatin 20 MG tablet  Commonly known as:  CRESTOR  Take 10-20 mg by mouth every evening. Alternates taking 0.5 tablet and 1 tablet daily      Vitamin D 2000 units Caps  Take 2,000 Units by mouth every morning.       Allergies  Allergen Reactions  . Other     Eye drop for glaucoma cannot remember name - gave her palpitations      The results of significant diagnostics from this hospitalization (including imaging, microbiology, ancillary and laboratory) are listed below for reference.    Significant Diagnostic Studies: Ct Angio Head W/cm &/or Wo Cm  10/20/2015  ADDENDUM REPORT: 10/20/2015 13:05 ADDENDUM: These results were called by telephone at the time of interpretation on 10/20/2015 at 1:05 pm to Dr. Audria Nine , who verbally acknowledged these results. Electronically Signed   By: Franchot Gallo M.D.   On: 10/20/2015 13:05  10/20/2015  CLINICAL DATA:  Acute onset severe vertigo EXAM: CT ANGIOGRAPHY HEAD AND NECK TECHNIQUE: Multidetector CT imaging of the head and neck was performed using the standard protocol during bolus administration of intravenous contrast. Multiplanar CT image reconstructions and MIPs were obtained to evaluate the vascular anatomy. Carotid stenosis measurements (when applicable) are obtained utilizing NASCET criteria, using the distal internal carotid diameter as the denominator. CONTRAST:  91mL OMNIPAQUE IOHEXOL 350 MG/ML SOLN COMPARISON:  CT head 10/20/2015 FINDINGS: CTA NECK Aortic arch: Mild dilatation ascending aorta measuring 39 mm. No dissection. Proximal great vessels patent. Pacemaker noted. Right carotid system: Normal right carotid. No significant atherosclerotic disease or dissection. No significant stenosis. Left carotid system: Normal left carotid. No significant atherosclerotic disease or dissection. No stenosis Vertebral arteries:Left vertebral dominant. Left vertebral artery widely patent to the basilar without stenosis. Small right vertebral artery occludes in the mid neck. This may be an acute occlusion with tapered irregular narrowing present. There is reconstitution of the distal right  vertebral artery due to collaterals. Skeleton: Cervical spondylosis. No fracture or acute bony abnormality. Other neck: none. CTA HEAD Anterior circulation: Cavernous carotid widely patent with minimal atherosclerotic calcification. No significant carotid stenosis. Anterior and middle cerebral arteries widely patent bilaterally without stenosis Posterior circulation: Left vertebral artery dominant. Small right vertebral artery also contributes to the basilar. PICA patent bilaterally. Irregular filling defect in the distal basilar compatible with embolus, from the right vertebral artery. There is flow in the posterior cerebral arteries bilaterally which are patent. There is flow in the superior cerebellar arteries also which are patent. Small right AICA patent. Venous sinuses: Patent Anatomic variants: None Delayed phase: Normal enhancement on postcontrast imaging. Hypodensity left lower cerebellum compatible with acute infarct. Probable small area of acute infarct left superior cerebellum. No hemorrhage. IMPRESSION: Early signs of acute infarct left   cerebellum. Occlusion of the mid right vertebral artery which appears acute. There is embolism to the distal basilar. Superior cerebellar and posterior cerebral arteries are patent bilaterally. No significant carotid stenosis. Left vertebral artery widely patent. I am currently awaiting a call back from the referring physician. Electronically Signed: By: Franchot Gallo M.D. On: 10/20/2015 13:01   Dg Chest 2 View  10/20/2015  CLINICAL DATA:  Pain following fall.  Slurred speech. EXAM: CHEST  2 VIEW COMPARISON:  Dec 18, 2013 FINDINGS: There is no appreciable edema or consolidation. The heart size is upper normal with pulmonary vascularity within normal limits. Patient is status post median sternotomy. There is a pacemaker with leads attached to the right atrium and right ventricle, stable. No adenopathy. No pneumothorax. There is degenerative change in the thoracic  spine. IMPRESSION: No edema or consolidation.  No change in cardiac silhouette. Electronically Signed   By: Lowella Grip III M.D.   On: 10/20/2015 13:38   Ct Head Wo Contrast  10/21/2015  CLINICAL DATA:  Follow-up cerebellar infarct. Unsteadiness. Dizziness and dysarthria. EXAM: CT HEAD WITHOUT CONTRAST TECHNIQUE: Contiguous axial images were obtained from the base of the skull through the vertex without intravenous contrast. COMPARISON:  CT head 10/20/2015. CTA head neck 10/20/2015. Catheter angiogram 10/20/2015. FINDINGS: Evolving cytotoxic edema in the LEFT cerebellum, with a fairly large area of cerebral infarction in the LEFT posterior inferior cerebellar artery territory, with cross-sectional measurements of 33 x 40 mm. A smaller area is seen more anteriorly and laterally, possible anterior inferior cerebellar artery territory on the LEFT. Both are nonhemorrhagic. No definite brainstem, RIGHT vertebral, or cerebral hemispheric acute infarct. Resolved hyperdense distal basilar artery noted  on the initial CT. Stable atrophy with chronic microvascular ischemic change. IMPRESSION: Resolved hyperdense distal basilar artery. Evolving changes of nonhemorrhagic LEFT cerebellar infarction. Continued surveillance warranted, as mass effect can develop from large cerebellar infarcts. Electronically Signed   By: Staci Righter M.D.   On: 10/21/2015 16:16   Ct Head Wo Contrast  10/20/2015  CLINICAL DATA:  PT. WOKE THIS AM WITH ROOM SPINNING, NAUSEA, DYSPHASIA WHICH HAS RESOLVED, VOMITING DURING TRANSPORT TO CT, LW EXAM: CT HEAD WITHOUT CONTRAST TECHNIQUE: Contiguous axial images were obtained from the base of the skull through the vertex without intravenous contrast. COMPARISON:  None. FINDINGS: There is mild central cortical atrophy. There is no intra or extra-axial fluid collection or mass lesion. The basilar cisterns and ventricles have a normal appearance. There is no CT evidence for acute infarction or  hemorrhage. Bone windows are unremarkable. IMPRESSION: 1. Atrophy. 2.  No evidence for acute intracranial abnormality. Electronically Signed   By: Nolon Nations M.D.   On: 10/20/2015 10:32   Ct Angio Neck W/cm &/or Wo/cm  10/20/2015  ADDENDUM REPORT: 10/20/2015 13:05 ADDENDUM: These results were called by telephone at the time of interpretation on 10/20/2015 at 1:05 pm to Dr. Audria Nine , who verbally acknowledged these results. Electronically Signed   By: Franchot Gallo M.D.   On: 10/20/2015 13:05  10/20/2015  CLINICAL DATA:  Acute onset severe vertigo EXAM: CT ANGIOGRAPHY HEAD AND NECK TECHNIQUE: Multidetector CT imaging of the head and neck was performed using the standard protocol during bolus administration of intravenous contrast. Multiplanar CT image reconstructions and MIPs were obtained to evaluate the vascular anatomy. Carotid stenosis measurements (when applicable) are obtained utilizing NASCET criteria, using the distal internal carotid diameter as the denominator. CONTRAST:  31mL OMNIPAQUE IOHEXOL 350 MG/ML SOLN COMPARISON:  CT head 10/20/2015 FINDINGS: CTA NECK Aortic arch: Mild dilatation ascending aorta measuring 39 mm. No dissection. Proximal great vessels patent. Pacemaker noted. Right carotid system: Normal right carotid. No significant atherosclerotic disease or dissection. No significant stenosis. Left carotid system: Normal left carotid. No significant atherosclerotic disease or dissection. No stenosis Vertebral arteries:Left vertebral dominant. Left vertebral artery widely patent to the basilar without stenosis. Small right vertebral artery occludes in the mid neck. This may be an acute occlusion with tapered irregular narrowing present. There is reconstitution of the distal right vertebral artery due to collaterals. Skeleton: Cervical spondylosis. No fracture or acute bony abnormality. Other neck: none. CTA HEAD Anterior circulation: Cavernous carotid widely patent with minimal  atherosclerotic calcification. No significant carotid stenosis. Anterior and middle cerebral arteries widely patent bilaterally without stenosis Posterior circulation: Left vertebral artery dominant. Small right vertebral artery also contributes to the basilar. PICA patent bilaterally. Irregular filling defect in the distal basilar compatible with embolus, from the right vertebral artery. There is flow in the posterior cerebral arteries bilaterally which are patent. There is flow in the superior cerebellar arteries also which are patent. Small right AICA patent. Venous sinuses: Patent Anatomic variants: None Delayed phase: Normal enhancement on postcontrast imaging. Hypodensity left lower cerebellum compatible with acute infarct. Probable small area of acute infarct left superior cerebellum. No hemorrhage. IMPRESSION: Early signs of acute infarct left   cerebellum. Occlusion of the mid right vertebral artery which appears acute. There is embolism to the distal basilar. Superior cerebellar and posterior cerebral arteries are patent bilaterally. No significant carotid stenosis. Left vertebral artery widely patent. I am currently awaiting a call back from the referring physician. Electronically Signed: By: Franchot Gallo  M.D. On: 10/20/2015 13:01   Ir Angiogram Extremity Right  10/22/2015  CLINICAL DATA:  Vertebrobasilar ischemia. CT angiogram revealing filling defect in the distal basilar artery. EXAM: BILATERAL COMMON CAROTID AND INNOMINATE ANGIOGRAPHY AND BILATERAL VERTEBRAL ARTERY ANGIOGRAMS PROCEDURE: Contrast: 60 mL OMNIPAQUE IOHEXOL 300 MG/ML  SOLN Anesthesia/Sedation:  Conscious sedation. Medications: Versed 1 mg IV.  Fentanyl 25 mcg IV. Following a full explanation of the procedure along with the potential associated complications, an informed witnessed consent was obtained. The right groin was prepped and draped in the usual sterile fashion. Thereafter using modified Seldinger technique, transfemoral  access into the right common femoral artery was obtained without difficulty. Over a 0.035 inch guidewire, a 5 French Pinnacle sheath was inserted. Through this, and also over 0.035 inch guidewire, a 5 [place] JB 1 catheter was advanced to the aortic arch region and selectively positioned in the right common carotid artery, the right subclavian artery, the left common carotid artery and the left vertebral artery. There were no acute complications. The patient tolerated the procedure well. FINDINGS: The left vertebral artery origin is normal. The vessel is seen to opacify normally to the cranial skull base. Normal opacification is seen of the left posterior inferior cerebellar artery and the left vertebrobasilar junction. The proximal 2/3 of the basilar artery, the anterior inferior cerebellar arteries demonstrate normal opacification in the capillary and the venous phases. There is retrograde opacification of the right vertebrobasilar junction to the level of the right posterior-inferior cerebellar artery. Just proximal to the level of the superior cerebellar arteries, there is a prominent filling defect well-defined with a meniscus sign. Opacification of the superior cerebellar arteries, the right posterior cerebral artery and the left posterior cerebral artery including the ophthalmic perforators is noted. Also noted is to and fro opacification at the level of the posterior communicating artery on the left side. This is inflow of non-opacified blood from the left internal carotid artery. The right subclavian arteriogram demonstrates slow flow into a right vertebral artery associated with filling defects in the mid cervical portion There is complete occlusion of this blood vessel at the level of C3. No distal reconstitution from the branch of the thyrocervical trunk is noted. The right common carotid arteriogram demonstrates the right external carotid artery and its major branches to be widely patent. The right  internal carotid artery at the bulb to the cranial skull base opacifies normally. The petrous, the cavernous and the supraclinoid segments are widely patent. A right posterior communicating artery is seen opacifying the right posterior cerebral artery distribution. The right middle cerebral artery and the right anterior cerebral artery are seen to opacify into the capillary and the venous phases. There is cross opacification via the anterior communicating artery of the left anterior cerebral artery A2 segment. The left common carotid arteriogram demonstrates the left external carotid artery and its major branches to be widely patent. The left internal carotid artery at the bulb to the cranial skull base is normal. The petrous, the cavernous and the supraclinoid segments are widely patent. A left posterior communicating artery is seen opacifying the left posterior cerebral artery distribution. There is transit retrograde flow into the distal basilar artery from the left posterior communicating artery. The left middle and the left anterior cerebral arteries opacify into the capillary and the venous phases. IMPRESSION: Prominent though non occlusive filling defect, well-defined in the distal basilar artery just proximal to the level of the superior cerebellar arteries with brisk antegrade flow of contrast. This probably represents  a thrombus. Occluded right vertebral artery at the level of C5 could be related to proximal atherosclerotic disease and/or dissection. Follow-up CT angiogram will be obtained in approximately 4 to 6 weeks from the time of the symptom onset. Electronically Signed   By: Luanne Bras M.D.   On: 10/21/2015 13:16   Ir Angio Intra Extracran Sel Com Carotid Innominate Bilat Mod Sed  10/22/2015  CLINICAL DATA:  Vertebrobasilar ischemia. CT angiogram revealing filling defect in the distal basilar artery. EXAM: BILATERAL COMMON CAROTID AND INNOMINATE ANGIOGRAPHY AND BILATERAL VERTEBRAL ARTERY  ANGIOGRAMS PROCEDURE: Contrast: 60 mL OMNIPAQUE IOHEXOL 300 MG/ML  SOLN Anesthesia/Sedation:  Conscious sedation. Medications: Versed 1 mg IV.  Fentanyl 25 mcg IV. Following a full explanation of the procedure along with the potential associated complications, an informed witnessed consent was obtained. The right groin was prepped and draped in the usual sterile fashion. Thereafter using modified Seldinger technique, transfemoral access into the right common femoral artery was obtained without difficulty. Over a 0.035 inch guidewire, a 5 French Pinnacle sheath was inserted. Through this, and also over 0.035 inch guidewire, a 5 [place] JB 1 catheter was advanced to the aortic arch region and selectively positioned in the right common carotid artery, the right subclavian artery, the left common carotid artery and the left vertebral artery. There were no acute complications. The patient tolerated the procedure well. FINDINGS: The left vertebral artery origin is normal. The vessel is seen to opacify normally to the cranial skull base. Normal opacification is seen of the left posterior inferior cerebellar artery and the left vertebrobasilar junction. The proximal 2/3 of the basilar artery, the anterior inferior cerebellar arteries demonstrate normal opacification in the capillary and the venous phases. There is retrograde opacification of the right vertebrobasilar junction to the level of the right posterior-inferior cerebellar artery. Just proximal to the level of the superior cerebellar arteries, there is a prominent filling defect well-defined with a meniscus sign. Opacification of the superior cerebellar arteries, the right posterior cerebral artery and the left posterior cerebral artery including the ophthalmic perforators is noted. Also noted is to and fro opacification at the level of the posterior communicating artery on the left side. This is inflow of non-opacified blood from the left internal carotid artery.  The right subclavian arteriogram demonstrates slow flow into a right vertebral artery associated with filling defects in the mid cervical portion There is complete occlusion of this blood vessel at the level of C3. No distal reconstitution from the branch of the thyrocervical trunk is noted. The right common carotid arteriogram demonstrates the right external carotid artery and its major branches to be widely patent. The right internal carotid artery at the bulb to the cranial skull base opacifies normally. The petrous, the cavernous and the supraclinoid segments are widely patent. A right posterior communicating artery is seen opacifying the right posterior cerebral artery distribution. The right middle cerebral artery and the right anterior cerebral artery are seen to opacify into the capillary and the venous phases. There is cross opacification via the anterior communicating artery of the left anterior cerebral artery A2 segment. The left common carotid arteriogram demonstrates the left external carotid artery and its major branches to be widely patent. The left internal carotid artery at the bulb to the cranial skull base is normal. The petrous, the cavernous and the supraclinoid segments are widely patent. A left posterior communicating artery is seen opacifying the left posterior cerebral artery distribution. There is transit retrograde flow into the distal basilar  artery from the left posterior communicating artery. The left middle and the left anterior cerebral arteries opacify into the capillary and the venous phases. IMPRESSION: Prominent though non occlusive filling defect, well-defined in the distal basilar artery just proximal to the level of the superior cerebellar arteries with brisk antegrade flow of contrast. This probably represents a thrombus. Occluded right vertebral artery at the level of C5 could be related to proximal atherosclerotic disease and/or dissection. Follow-up CT angiogram will be  obtained in approximately 4 to 6 weeks from the time of the symptom onset. Electronically Signed   By: Luanne Bras M.D.   On: 10/21/2015 13:16   Ir Angio Vertebral Sel Vertebral Uni L Mod Sed  10/22/2015  CLINICAL DATA:  Vertebrobasilar ischemia. CT angiogram revealing filling defect in the distal basilar artery. EXAM: BILATERAL COMMON CAROTID AND INNOMINATE ANGIOGRAPHY AND BILATERAL VERTEBRAL ARTERY ANGIOGRAMS PROCEDURE: Contrast: 60 mL OMNIPAQUE IOHEXOL 300 MG/ML  SOLN Anesthesia/Sedation:  Conscious sedation. Medications: Versed 1 mg IV.  Fentanyl 25 mcg IV. Following a full explanation of the procedure along with the potential associated complications, an informed witnessed consent was obtained. The right groin was prepped and draped in the usual sterile fashion. Thereafter using modified Seldinger technique, transfemoral access into the right common femoral artery was obtained without difficulty. Over a 0.035 inch guidewire, a 5 French Pinnacle sheath was inserted. Through this, and also over 0.035 inch guidewire, a 5 [place] JB 1 catheter was advanced to the aortic arch region and selectively positioned in the right common carotid artery, the right subclavian artery, the left common carotid artery and the left vertebral artery. There were no acute complications. The patient tolerated the procedure well. FINDINGS: The left vertebral artery origin is normal. The vessel is seen to opacify normally to the cranial skull base. Normal opacification is seen of the left posterior inferior cerebellar artery and the left vertebrobasilar junction. The proximal 2/3 of the basilar artery, the anterior inferior cerebellar arteries demonstrate normal opacification in the capillary and the venous phases. There is retrograde opacification of the right vertebrobasilar junction to the level of the right posterior-inferior cerebellar artery. Just proximal to the level of the superior cerebellar arteries, there is a  prominent filling defect well-defined with a meniscus sign. Opacification of the superior cerebellar arteries, the right posterior cerebral artery and the left posterior cerebral artery including the ophthalmic perforators is noted. Also noted is to and fro opacification at the level of the posterior communicating artery on the left side. This is inflow of non-opacified blood from the left internal carotid artery. The right subclavian arteriogram demonstrates slow flow into a right vertebral artery associated with filling defects in the mid cervical portion There is complete occlusion of this blood vessel at the level of C3. No distal reconstitution from the branch of the thyrocervical trunk is noted. The right common carotid arteriogram demonstrates the right external carotid artery and its major branches to be widely patent. The right internal carotid artery at the bulb to the cranial skull base opacifies normally. The petrous, the cavernous and the supraclinoid segments are widely patent. A right posterior communicating artery is seen opacifying the right posterior cerebral artery distribution. The right middle cerebral artery and the right anterior cerebral artery are seen to opacify into the capillary and the venous phases. There is cross opacification via the anterior communicating artery of the left anterior cerebral artery A2 segment. The left common carotid arteriogram demonstrates the left external carotid artery and its major  branches to be widely patent. The left internal carotid artery at the bulb to the cranial skull base is normal. The petrous, the cavernous and the supraclinoid segments are widely patent. A left posterior communicating artery is seen opacifying the left posterior cerebral artery distribution. There is transit retrograde flow into the distal basilar artery from the left posterior communicating artery. The left middle and the left anterior cerebral arteries opacify into the capillary  and the venous phases. IMPRESSION: Prominent though non occlusive filling defect, well-defined in the distal basilar artery just proximal to the level of the superior cerebellar arteries with brisk antegrade flow of contrast. This probably represents a thrombus. Occluded right vertebral artery at the level of C5 could be related to proximal atherosclerotic disease and/or dissection. Follow-up CT angiogram will be obtained in approximately 4 to 6 weeks from the time of the symptom onset. Electronically Signed   By: Luanne Bras M.D.   On: 10/21/2015 13:16    Microbiology: Recent Results (from the past 240 hour(s))  MRSA PCR Screening     Status: None   Collection Time: 10/20/15  4:30 PM  Result Value Ref Range Status   MRSA by PCR NEGATIVE NEGATIVE Final    Comment:        The GeneXpert MRSA Assay (FDA approved for NASAL specimens only), is one component of a comprehensive MRSA colonization surveillance program. It is not intended to diagnose MRSA infection nor to guide or monitor treatment for MRSA infections.      Labs: Basic Metabolic Panel:  Recent Labs Lab 10/20/15 1053 10/22/15 0519  NA 141 142  K 3.5 3.8  CL 104 112*  CO2 23 23  GLUCOSE 265* 145*  BUN 21* 14  CREATININE 0.92 0.87  CALCIUM 10.1 8.8*   Liver Function Tests:  Recent Labs Lab 10/20/15 1053  AST 21  ALT 16  ALKPHOS 40  BILITOT 0.6  PROT 6.0*  ALBUMIN 3.8   No results for input(s): LIPASE, AMYLASE in the last 168 hours. No results for input(s): AMMONIA in the last 168 hours. CBC:  Recent Labs Lab 10/20/15 1053 10/21/15 0200 10/22/15 0519  WBC 9.3 9.3 9.4  NEUTROABS 7.1  --   --   HGB 12.0 11.4* 11.2*  HCT 38.1 36.4 35.9*  MCV 86.6 85.8 86.5  PLT 161 164 141*   Cardiac Enzymes: No results for input(s): CKTOTAL, CKMB, CKMBINDEX, TROPONINI in the last 168 hours. BNP: BNP (last 3 results) No results for input(s): BNP in the last 8760 hours.  ProBNP (last 3 results) No results  for input(s): PROBNP in the last 8760 hours.  CBG:  Recent Labs Lab 10/21/15 1123 10/21/15 1538 10/21/15 2008 10/22/15 0832 10/22/15 1126  GLUCAP 143* 111* 219* 139* 111*       Signed:  Dia Crawford, MD [facility] [phone] pager

## 2015-10-22 NOTE — Progress Notes (Signed)
RN clarified with Dr. Leonie Man via telephone that it is ok for patient to d/c home today as long as it is ok with primary service and that patient is on Elloquis and has been evaluated by PT. PT note in chart.

## 2015-10-22 NOTE — Progress Notes (Signed)
STROKE TEAM PROGRESS NOTE   HISTORY OF PRESENT ILLNESS AMBRIELLA Collins is an 82 y.o. female patient who presented to the emergency room with symptoms of dizziness and gait problems. She went to bed at 2:30 AM which is her last known normal. After she woke up this morning she walked she feet and felt dizzy and unstable. She reported falling more to the right side at that time. She'll brought into the emergency room by her daughter for further evaluation. Patient denies any vision or speech problems. There is some concern for possible slurred speech initially but patient is not sure. Denies any focal weakness in upper or lower extremities no new sensory problems.  She has past medical history of A. fibrillation diagnosis in the past and was on anticoagulation prior to 2011 for a couple of years. She discontinued due to her own preference and has been on aspirin 81 mg daily since then. She also has pacemaker, and I did call replacement history, history of stroke in 2010 with some right-sided weakness which has resolved.   Date last known well: 03/19 /2017 Time last known well: 2.30 am tPA Given: No: Outside tPA window.       SUBJECTIVE (INTERVAL HISTORY) Her  RN is at the bedside.  Overall she feels her condition is rapidly improving. Her blood pressure is adequately controlled. She has  been started on eliquis for anticoagulation     OBJECTIVE Temp:  [98.1 F (36.7 C)-100.4 F (38 C)] 98.1 F (36.7 C) (03/21 1200) Pulse Rate:  [59-109] 62 (03/21 1200) Cardiac Rhythm:  [-] Normal sinus rhythm (03/21 0800) Resp:  [12-28] 20 (03/21 1200) BP: (112-149)/(58-101) 149/75 mmHg (03/21 1200) SpO2:  [87 %-99 %] 93 % (03/21 1200)  CBC:   Recent Labs Lab 10/20/15 1053 10/21/15 0200 10/22/15 0519  WBC 9.3 9.3 9.4  NEUTROABS 7.1  --   --   HGB 12.0 11.4* 11.2*  HCT 38.1 36.4 35.9*  MCV 86.6 85.8 86.5  PLT 161 164 141*    Basic Metabolic Panel:   Recent Labs Lab 10/20/15 1053  10/22/15 0519  NA 141 142  K 3.5 3.8  CL 104 112*  CO2 23 23  GLUCOSE 265* 145*  BUN 21* 14  CREATININE 0.92 0.87  CALCIUM 10.1 8.8*    Lipid Panel:     Component Value Date/Time   CHOL 119 10/21/2015 0200   TRIG 44 10/21/2015 0200   HDL 45 10/21/2015 0200   CHOLHDL 2.6 10/21/2015 0200   VLDL 9 10/21/2015 0200   LDLCALC 65 10/21/2015 0200   HgbA1c:  Lab Results  Component Value Date   HGBA1C 7.0* 10/21/2015   Urine Drug Screen:     Component Value Date/Time   LABOPIA NONE DETECTED 10/20/2015 1335   COCAINSCRNUR NONE DETECTED 10/20/2015 1335   LABBENZ NONE DETECTED 10/20/2015 1335   AMPHETMU NONE DETECTED 10/20/2015 1335   THCU NONE DETECTED 10/20/2015 1335   LABBARB NONE DETECTED 10/20/2015 1335      IMAGING  Ct Angio Head W/cm &/or Wo Cm 10/20/2015   Early signs of acute infarct left cerebellum. Occlusion of the mid right vertebral artery which appears acute. There is embolism to the distal basilar. Superior cerebellar and posterior cerebral arteries are patent bilaterally.    Ct Angio Neck W/cm &/or Wo/cm 10/20/2015   No significant carotid stenosis. Left vertebral artery widely patent. I am currently awaiting a call back from the referring physician.    Dg Chest 2 View 10/20/2015  No edema or consolidation.  No change in cardiac silhouette.    Ct Head Wo Contrast 10/20/2015   1. Atrophy.  2.  No evidence for acute intracranial abnormality.    Cerebral angiogram S/P 4 vessel cerebral arteriogram. RT CFA approach. Findings. 1.Non occlusive filling defect,probable thrombus, in the distal basilar artery with patency of thalamoperforaters,and PComs. 2.Occluded RT VA at C5-6.   PHYSICAL EXAM Pleasant elderly Caucasian lady sitting up comfortably in a bedside chair not in distress. . Afebrile. Head is nontraumatic. Neck is supple without bruit.    Cardiac exam no murmur or gallop. Lungs are clear to auscultation. Distal pulses are well  felt.  Neurological Exam ;  Awake  Alert oriented x 3. Normal speech and language.eye movements full without nystagmus.fundi were not visualized. Vision acuity and fields appear normal. Hearing is normal. Palatal movements are normal. Face symmetric. Tongue midline. Normal strength, tone, reflexes and coordination. Normal sensation. Gait deferred.    ASSESSMENT/PLAN Evelyn Collins is a 82 y.o. female with history of history of aortic valve replacement, hyperlipidemia, diabetes mellitus, hypertension, status post permanent pacemaker, coronary artery disease, and previous stroke  presenting with dizziness, gait disturbance, possible slurred speech.. She did not receive IV t-PA due to late presentation.  Stroke:  Non-Dominant   Resultant  gait ataxia   MRI - permanent pacemaker  MRA - permanent pacemaker  Carotid Doppler - refer to CTA of the neck  Cerebral angiogram - see above  2D Echo  EF 55-60%. May need TEE to further evaluate prosthetic valve. No definite cardiac source of emboli.  LDL 65  HgbA1c 7.0  VTE prophylaxis - Eliquis Diet heart healthy/carb modified Room service appropriate?: Yes; Fluid consistency:: Thin  aspirin 81 mg daily prior to admission, now on Eliquis (apixaban) daily  Patient counseled to be compliant with her antithrombotic medications  Ongoing aggressive stroke risk factor management  Therapy recommendations: Home health physical therapy recommended  Disposition: Pending  Hypertension  Blood pressure tends to run low  Permissive hypertension (OK if < 220/120) but gradually normalize in 5-7 days  Hyperlipidemia  Home meds:  Crestor 10 mg daily resumed in hospital  LDL 65, goal < 70  Continue statin at discharge  Diabetes  HgbA1c 7.0, goal < 7.0  Uncontrolled  Other Stroke Risk Factors  Advanced age  Hx stroke/TIA  Coronary artery disease  Atrial fibrillation   Other Active Problems    Hospital day # 2  I have  personally examined this patient, reviewed notes, independently viewed imaging studies, participated in medical decision making and plan of care. I have made any additions or clarifications directly to the above note. Agree with note above. She presented with dizziness and gait ataxia due to multiple posterior circulation infarcts secondary to basilar tip embolism from atrial fibrillation. She remains at risk for neurological worsening, recurrent stroke, TIA and needs close neurological monitoring and anticoagulation. I had a long discussion with the patient's great results were stroke risk, stroke evaluation, secondary prevention and answered questions.Continue eliquis for afib with bioprosthetic heart valve. Likely discharge home tomorrow if stable Stroke team will sign off. Transfer to floor and Dc home     Antony Contras, MD Medical Director [place] Pager: [phone] 10/22/2015 2:27 PM     To contact Stroke Continuity provider, please refer to [email or web address] After hours, contact General Neurology

## 2015-10-22 NOTE — Progress Notes (Signed)
Dr. Thereasa Solo asked about ECHO and prior records.  She had a prior AVR 09/28/07 with 23 mm Mosaic valve at [facility] in [place].  She moved here a few years ago.    She has had increased AV gradients that were followed by her cardiologist in [place] and later by me here.  ECHO in December showed a mean gradient of around 23 which is increased for this valve.   Current ECHO with mean gradient around 30 that may account for differences in machines and technique.  She is not symptomatic     Recent CVA noted. I agree with need for warfarin with prosthetic valve.   Kerry Hough MD [facility]

## 2015-10-22 NOTE — Progress Notes (Signed)
Discharge orders received. Discharge information, follow up appointments, and medications explained and given to pt, husband, and pts caregiver (daughter). Gave pt the case managers number to follow up in the morning about home health needs. Pt discharged home with daughter via wheelchair in stable condition

## 2015-10-22 NOTE — Care Management Note (Addendum)
Case Management Note  Patient Details  Name: Evelyn Collins MRN: [id number] Date of Birth: 1932-11-03  Subjective/Objective:   Pt admitted on 10/20/15 with stroke.  PTA, pt independent, lives with spouse, who has dementia.                   Action/Plan: Met with pt and daughter to discuss dc plans.  PT recommending HHPT at dc.  Pt states she is planning to transition to ALF from home soon, as she does not feel safe at home with her husband.  He was not able to call 911 when she had her stroke and was incapacitated.  She feels the time has come to start making this major life change.  Pt/daughter given list of private duty sitters/aides to help with care until transition can be made.  [facility] list of [facility] providers given to pt/daughter to review for [facility] needs.  Also provided list of all inclusive ALFs in [place] that in addition to [place], pt would benefit from home CSW to assist with transition to ALF.  MD notified for orders.  Pt may dc later today, per bedside nurse; will follow for orders.    Expected Discharge Date:                  Expected Discharge Plan:  [place]  In-House Referral:     Discharge planning Services  CM Consult  Post Acute Care Choice:    Choice offered to:     DME Arranged:    DME Agency:     HH Arranged:    [place] Agency:     Status of Service:  In process, will continue to follow  Medicare Important Message Given:    Date Medicare IM Given:    Medicare IM give by:    Date Additional Medicare IM Given:    Additional Medicare Important Message give by:     If discussed at [place] of Stay Meetings, dates discussed:    Additional Comments:  Checked benefits for Eliquis (below).  Pt given 30 day free trial card for first Rx.    COVER- YES  CO-PAY- $ 386.71  TIER- 3 DRUG  PRIOR APPROVAL - NO  PHARMACY: [facility]   PATIENT HAS A QUANTITY LIMITED OF 74 PILL FOR 30 DAY  $ 400.00 DEDUCTIBLE FOR MEDICINE MET $13.66  WHEN  DEDUCTIBLE HAS BEEN MET PATIENT WILL PAY 20%  AND HUMAN WILL PAY 80 %   Reinaldo Raddle, RN, BSN  Trauma/Neuro ICU Case Manager [phone]

## 2015-10-22 NOTE — Progress Notes (Signed)
Physical Therapy Treatment Patient Details Name: Evelyn Collins MRN: [id number] DOB: [birth year] Today's Date: 10/22/2015    History of Present Illness pt presents with Basilar Artery Thrombus and R Vertebral Artery Occlusion.  pt s/p Cerebral Angiogram.  pt with hx of CABG, Pacemaker, Glucoma, AVR, CVA, HTN, DM, Asthma, and CAD.      PT Comments    Evelyn Collins continues to express anxiety about fear of falling and presents w/ min instability w/ high level balances as documented below utilizing the DGI.  SW notified that pt and family are interested in receiving guidance from SW and CM regarding assist available at d/c as well as long term planning. Encouraged pt, once she has fully recovered, to attend a support group for caregivers for family members w/ dementia.  Pt very receptive and already has contact information to follow up w/ support group in her area.   Follow Up Recommendations  Home health PT;Supervision - Intermittent     Equipment Recommendations  None recommended by PT    Recommendations for Other Services       Precautions / Restrictions Precautions Precautions: Fall Precaution Comments: Watch BP Restrictions Weight Bearing Restrictions: No    Mobility  Bed Mobility               General bed mobility comments: Pt sitting in recliner chair upon PT arrival  Transfers Overall transfer level: Needs assistance Equipment used: None Transfers: Sit to/from Stand Sit to Stand: Supervision         General transfer comment: pt moves very slowly and with definite use of UEs.  pt indicates being nervous.    Ambulation/Gait Ambulation/Gait assistance: Min guard Ambulation Distance (Feet): 400 Feet Assistive device: None Gait Pattern/deviations: Step-through pattern;Decreased stride length;Drifts right/left   Gait velocity interpretation: Below normal speed for age/gender General Gait Details: pt indicates being nervous and fearful after falling at home.  Pt  with mild balance deficits (see notes on DGI below).   Stairs Stairs: Yes Stairs assistance: Min guard Stair Management: One rail Right;Alternating pattern;Forwards Number of Stairs: 10 General stair comments: Pt becomes nauseous after ascending 10 steps using railing for support as pt is unsteady w/o use of rail.  Rest break at top of steps before descending w/ in guard assist.  Wheelchair Mobility    Modified Rankin (Stroke Patients Only) Modified Rankin (Stroke Patients Only) Pre-Morbid Rankin Score: No symptoms Modified Rankin: Moderately severe disability     Balance Overall balance assessment: Needs assistance Sitting-balance support: No upper extremity supported;Feet supported Sitting balance-Leahy Scale: Good     Standing balance support: No upper extremity supported;During functional activity Standing balance-Leahy Scale: Fair Standing balance comment: Pt w/ min instability w/ dynamic activity                    Cognition Arousal/Alertness: Awake/alert Behavior During Therapy: WFL for tasks assessed/performed (tearful at times when reflecting on day of incident) Overall Cognitive Status: Within Functional Limits for tasks assessed                      Exercises      General Comments General comments (skin integrity, edema, etc.): Daughter present at very end of session.  Pt and daughter expressed interest in speaking w/ someone who can assist w/ setting up assist at home and to assist in arranging long term living arrangements. Contacted SW who is contacting CM to assist family w/ this.  RN made aware.  Pertinent Vitals/Pain Pain Assessment: Faces Faces Pain Scale: Hurts little more Pain Location: headache Pain Descriptors / Indicators: Headache Pain Intervention(s): Limited activity within patient's tolerance;Monitored during session    Home Living                      Prior Function            PT Goals (current goals can  now be found in the care plan section) Acute Rehab PT Goals Patient Stated Goal: Back to normal. PT Goal Formulation: With patient Time For Goal Achievement: 11/04/15 Potential to Achieve Goals: Good Progress towards PT goals: Progressing toward goals    Frequency  Min 4X/week    PT Plan Current plan remains appropriate    Co-evaluation             End of Session Equipment Utilized During Treatment: Gait belt Activity Tolerance: Patient tolerated treatment well Patient left: in chair;with call bell/phone within reach;with family/visitor present     Time: [id number] PT Time Calculation (min) (ACUTE ONLY): 25 min  Charges:  $Gait Training: 23-37 mins                    G Codes:      Collie Siad PT, DPT  Pager: [phone] Phone: [phone] 10/22/2015, 11:31 AM

## 2015-10-22 NOTE — Progress Notes (Signed)
Occupational Therapy Treatment Patient Details Name: Evelyn Collins MRN: [id number] DOB: 01-19-1933 Today's Date: 10/22/2015    History of present illness pt presents with Basilar Artery Thrombus and R Vertebral Artery Occlusion.  pt s/p Cerebral Angiogram.  pt with hx of CABG, Pacemaker, Glucoma, AVR, CVA, HTN, DM, Asthma, and CAD.     OT comments  Pt progressing toward goals.  Discussed safety concerns and recommendations for home.   Follow Up Recommendations  No OT follow up;Supervision - Intermittent    Equipment Recommendations  Tub/shower seat    Recommendations for Other Services      Precautions / Restrictions Precautions Precautions: Fall       Mobility Bed Mobility                  Transfers                      Balance                                   ADL                                         General ADL Comments: Pt very tearful during session and reports feeling stressed over long term plans, and need for more support at discharge.  Discussed safety issues with pt      Vision                     Perception     Praxis      Cognition   Behavior During Therapy: WFL for tasks assessed/performed Overall Cognitive Status: Within Functional Limits for tasks assessed                       Extremity/Trunk Assessment               Exercises     Shoulder Instructions       General Comments      Pertinent Vitals/ Pain       Pain Assessment: Faces Faces Pain Scale: Hurts little more Pain Location: headache  Pain Descriptors / Indicators: Headache Pain Intervention(s): Monitored during session  Home Living                                          Prior Functioning/Environment              Frequency Min 2X/week     Progress Toward Goals  OT Goals(current goals can now be found in the care plan section)  Progress towards OT goals: Progressing  toward goals     Plan Discharge plan remains appropriate    Co-evaluation                 End of Session     Activity Tolerance Patient tolerated treatment well   Patient Left in bed;with call bell/phone within reach   Nurse Communication Mobility status        Time: [id number] OT Time Calculation (min): 37 min  Charges: OT General Charges $OT Visit: 1 Procedure OT Treatments $Therapeutic Activity: 23-37 mins  Kenneth Cuaresma M 10/22/2015, 5:30 PM

## 2015-10-23 NOTE — Progress Notes (Signed)
10/23/15 0941am  Pt discharged after office hours last PM.  Home health ordered by MD; pt/family chose [facility] for [facility] services.  Referral made to [facility] with [facility]; start of care 24-48h post dc date.  Called pt's home; spoke with daughter and notified her that [facility] had been arranged.    Reinaldo Raddle, RN, BSN  Trauma/Neuro ICU Case Manager [phone]

## 2015-10-24 ENCOUNTER — Telehealth: Payer: Self-pay | Admitting: Neurology

## 2015-10-24 NOTE — Telephone Encounter (Signed)
Rn call patient back about her nausea. Rn only talk to patient because her daughter is not on the [facility] list. Pt was just discharge from the hospital by Dr.Sethi on 10/20/2015. Rn stated we dont manage nausea and she would have to call her PCP. Pt stated she call the PCP office and her MD was not in. Rn advised patient to try an urgent care for her nausea. Rn stated to patient that she has to schedule an follow up with Dr.Sethi for hospital follow up. Rn put patient on hold to schedule hospital follow up. When nurse came back to phone, it disconnect.

## 2015-10-24 NOTE — Telephone Encounter (Signed)
IF patient or daughter calls back, send her to referrals to schedule a hospital follow up with Dr Leonie Man.Thanks. Please tell her to call her PCP back or urgent care for management of the nausea.

## 2015-10-24 NOTE — Telephone Encounter (Signed)
Pt's daughter called stating since discharge her mother has been nauseous and has not been able to keep any of her medication down. They are requesting a call as soon as possible. Daughter- Joelene Millin [phone]. We do not have DPR for the pt and she has not been seen here in our office. Pt was just recently d/c'd. The pt however is with her daughter and can talk to you.

## 2015-10-25 ENCOUNTER — Other Ambulatory Visit: Payer: Self-pay

## 2015-10-25 DIAGNOSIS — Z950 Presence of cardiac pacemaker: Secondary | ICD-10-CM | POA: Diagnosis not present

## 2015-10-25 DIAGNOSIS — R2689 Other abnormalities of gait and mobility: Secondary | ICD-10-CM | POA: Diagnosis not present

## 2015-10-25 DIAGNOSIS — M150 Primary generalized (osteo)arthritis: Secondary | ICD-10-CM | POA: Diagnosis not present

## 2015-10-25 DIAGNOSIS — I69398 Other sequelae of cerebral infarction: Secondary | ICD-10-CM | POA: Diagnosis not present

## 2015-10-25 DIAGNOSIS — I4891 Unspecified atrial fibrillation: Secondary | ICD-10-CM | POA: Diagnosis not present

## 2015-10-25 DIAGNOSIS — E119 Type 2 diabetes mellitus without complications: Secondary | ICD-10-CM | POA: Diagnosis not present

## 2015-10-25 DIAGNOSIS — I69314 Frontal lobe and executive function deficit following cerebral infarction: Secondary | ICD-10-CM | POA: Diagnosis not present

## 2015-10-25 DIAGNOSIS — J45909 Unspecified asthma, uncomplicated: Secondary | ICD-10-CM | POA: Diagnosis not present

## 2015-10-25 DIAGNOSIS — E785 Hyperlipidemia, unspecified: Secondary | ICD-10-CM | POA: Diagnosis not present

## 2015-10-25 DIAGNOSIS — Z7984 Long term (current) use of oral hypoglycemic drugs: Secondary | ICD-10-CM | POA: Diagnosis not present

## 2015-10-25 DIAGNOSIS — I10 Essential (primary) hypertension: Secondary | ICD-10-CM | POA: Diagnosis not present

## 2015-10-25 DIAGNOSIS — I2510 Atherosclerotic heart disease of native coronary artery without angina pectoris: Secondary | ICD-10-CM | POA: Diagnosis not present

## 2015-10-25 NOTE — Patient Outreach (Signed)
[facility]) Care Management  10/25/2015  ABRIAH LIAKOS [birth year] [id number]  SUBJECTIVE; Telephone call to patient regarding EMMI stroke red referral. HIPAA verified by patient. Patient gave verbal authorization to speak with her daughter, Avishka Statler regarding all of her health information. Patient and daughter, Maritere Brunjes on phone together to speak with this RNCM.  Patient states states the first day she was home from the hospital she was very nauseated. Patient states she was unable to eat or take her medications.  Patient states she has since received a prescription for Reglan.  Patient reports the Reglan is working well and she is now able to eat and take her medications. Patients daughter states [facility] has begun services with patient. Daughter states the home care nurse informed them a social worker would  follow up with patient. Daughter states patient is the primary care giver for her husband who has dementia. Daughter request to speak with  Social worker on today or tomorrow because she would be going back to [place] on Monday  Morning 10/28/15. Daughter states she would like to discuss additional care with   Contact number for [facility] [phone] Patient states she and her daughter attempted to scheduled a follow up appointment for her with her primary MD.  Patient states her primary MD is not in today and office will have to call her back with an appointment time.  Patient states she has an appointment scheduled with Dr. Leonie Man for 10/28/15.  Patient states she was not restarted on her calcium once she left the hospital. Patient states she will follow up with her doctor regarding this.  Patient states she has a little dizziness but its not bad. Daughter states she has noticed that patient tires easily, has some weakness and patients processing is a little fuzzy. Patient denies any falls. Patient states she does not use an assistive device for  ambulation.  Patient states she has all of her medications at this time and can afford them.  RNCM reviewed signs/symptoms of stroke with patient and daughter. Advised to call 911 for stroke like symptoms.  RNCM discussed symptoms of bleeding due to being on Eliquis.  RNCM advised patient to continue to take her medications as prescribed and report any unusual symptoms to the doctor  RNCM contacted [facility] and spoke with Branka. Branka states patients assigned social worker is Fredna Dow. RNCM request patient and her daughter receive a call from social worker on today or tomorrow to follow up on daughters concerns.   ASSESSMENT: EMMI stroke RED referral. PLAN:  RNCM will follow up with patient within 1 week.   Quinn Plowman RN,BSN,CCM [facility] Telephonic  [phone]

## 2015-10-28 ENCOUNTER — Encounter: Payer: Self-pay | Admitting: Neurology

## 2015-10-28 ENCOUNTER — Ambulatory Visit (INDEPENDENT_AMBULATORY_CARE_PROVIDER_SITE_OTHER): Payer: Medicare Other | Admitting: Neurology

## 2015-10-28 VITALS — BP 109/65 | HR 63 | Ht 67.0 in | Wt 159.0 lb

## 2015-10-28 DIAGNOSIS — I651 Occlusion and stenosis of basilar artery: Secondary | ICD-10-CM | POA: Diagnosis not present

## 2015-10-28 DIAGNOSIS — I639 Cerebral infarction, unspecified: Secondary | ICD-10-CM | POA: Diagnosis not present

## 2015-10-28 DIAGNOSIS — F329 Major depressive disorder, single episode, unspecified: Secondary | ICD-10-CM

## 2015-10-28 DIAGNOSIS — F32A Depression, unspecified: Secondary | ICD-10-CM

## 2015-10-28 NOTE — Progress Notes (Signed)
[facility] [address] [place] [address] [phone]       OFFICE FOLLOW-UP NOTE  [name] Date of Birth:  1933-07-26 Medical Record Number:  [id number]   HPI: 59 year Caucasian lady seen today for first office follow-up following hospital admission for stroke. This was an urgent visit requested by the patient and family members due to multiple questions about his recent stroke. Patient is accompanied by her husband, son and daughter were present for this visit throughout. Evelyn Collins is an 82 y.o. female patient who presented to the emergency room with symptoms of dizziness and gait problems. She went to bed at 2:30 AM  On 3/19/17which is her last known normal. After she woke up this morning she walked she feet and felt dizzy and unstable. She reported falling more to the right side at that time. She'll brought into the emergency room by her daughter for further evaluation. Patient denies any vision or speech problems. There is some concern for possible slurred speech initially but patient is not sure. Denies any focal weakness in upper or lower extremities no new sensory problems. She has past medical history of A. fibrillation diagnosis in the past and was on anticoagulation prior to 2011 for a couple of years. She discontinued due to her own preference and has been on aspirin 81 mg daily since then. She also has pacemaker, and I did call replacement history, history of stroke in 2010 with some right-sided weakness which has resolved. Date last known well: 03/19 /2017 Time last known well: 2.30 am tPA Given: No: Outside tPA window.   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 Patient had catheter angiogram performed by Dr. Estanislado Collins which showed nonocclusive filling defect probably  a thrombus in the distal basilar artery with patency of the proximal portions of both posterior cerebral arteries. Right vertebral artery is occluded at C5-6 segment. The patient was not felt to be a candidate for endovascular intervention. LDL cholesterol of 65. Hemoglobin A1c was 7.0. Patient had a permanent pacemaker and hence MRI could not be performed. Transversely echo showed normal ejection fraction. Patient was previously on Coumadin for several years for A. fib but this had been discontinued by her primary care physician and she was aspirin at the time of the stroke. She was started on eliquis for secondary stroke prevention. She states she's done well she is having only minor bruising but no bleeding episodes. She is currently at home and home physical therapy has not yet been started but they had received some phone calls. Family had a lot of questions about what equipment the patient will need and water therapy needs are. I explained to them that since on ROS:   14 system review of systems is positive for  activity change, with a change, fatigue, eye itching, palpitations, rectal bleeding, diarrhea, nausea, vomiting, joint pain, incontinence of bladder, easy bruising and bleeding, dizziness, headache, weakness, slurred speech, decreased concentration, depression, itching, joint pain and all other systems negative  PMH:  Past Medical History  Diagnosis Date  . H/O aortic valve replacement   . Inguinal hernia   . Hyperlipidemia   . Diabetes mellitus without complication ([place])   . Glaucoma   . Hypertension   . Allergy to pollen   . Pacemaker   .  Coronary artery disease   . Asthma   . Constipation   . Arthritis   . Stroke [facility]) 2010ish    no residual  . Glaucoma     Social History:  Social History   Social History  . Marital Status: Married    Spouse Name: N/A  . Number of Children: N/A  . Years of Education: N/A   Occupational History  . Not on file.   Social History Main  Topics  . Smoking status: Never Smoker   . Smokeless tobacco: Never Used  . Alcohol Use: No  . Drug Use: No  . Sexual Activity: Not on file   Other Topics Concern  . Not on file   Social History Narrative    Medications:   Current Outpatient Prescriptions on File Prior to Visit  Medication Sig Dispense Refill  . ACCU-CHEK FASTCLIX LANCETS MISC by Does not apply route.    Marland Kitchen albuterol (PROVENTIL HFA;VENTOLIN HFA) 108 (90 BASE) MCG/ACT inhaler Inhale 2 puffs into the lungs every 4 (four) hours as needed for shortness of breath (cough). Reported on 10/25/2015    . albuterol (PROVENTIL) (2.5 MG/3ML) 0.083% nebulizer solution Take 2.5 mg by nebulization every 6 (six) hours as needed for wheezing or shortness of breath. Reported on 10/25/2015    . alendronate (FOSAMAX) 70 MG tablet Take 70 mg by mouth every Monday. Take with a full glass of water on an empty stomach.    Marland Kitchen apixaban (ELIQUIS) 5 MG TABS tablet Take 1 tablet (5 mg total) by mouth 2 (two) times daily. 60 tablet 0  . Cholecalciferol (VITAMIN D) 2000 UNITS CAPS Take 2,000 Units by mouth every morning.    . docusate sodium (COLACE) 100 MG capsule Take 100 mg by mouth 2 (two) times daily.    . dorzolamide-timolol (COSOPT) 22.3-6.8 MG/ML ophthalmic solution Place 1 drop into both eyes 2 (two) times daily.    . Glucos-Chondroit-Hyaluron-MSM (GLUCOSAMINE CHONDROITIN JOINT PO) Take 1 tablet by mouth 2 (two) times daily.     Marland Kitchen latanoprost (XALATAN) 0.005 % ophthalmic solution Place 1 drop into both eyes at bedtime.    Marland Kitchen losartan (COZAAR) 25 MG tablet Take 0.5 tablets (12.5 mg total) by mouth every morning. (Patient taking differently: Take 12.5 mg by mouth every morning. Take one daily) 30 tablet 0  . metFORMIN (GLUCOPHAGE) 1000 MG tablet Take 1,000 mg by mouth 2 (two) times daily with a meal.    . Multiple Vitamin (MULTIVITAMIN WITH MINERALS) TABS tablet Take 1 tablet by mouth every other day.    . Omega-3 Fatty Acids (FISH OIL) 1200 MG CAPS  Take 1,200 mg by mouth every morning.    . rosuvastatin (CRESTOR) 20 MG tablet Take 10-20 mg by mouth every evening. Alternates taking 0.5 tablet and 1 tablet daily     No current facility-administered medications on file prior to visit.    Allergies:   Allergies  Allergen Reactions  . Other     Eye drop for glaucoma cannot remember name - gave her palpitations    Physical Exam General: well developed, well nourished Caucasian lady, seated, in no evident distress Head: head normocephalic and atraumatic.  Neck: supple with no carotid or supraclavicular bruits Cardiovascular: regular rate and rhythm, no murmurs Musculoskeletal: no deformity Skin:  no rash/petichiae Vascular:  Normal pulses all extremities Filed Vitals:   10/28/15 1033  BP: 109/65  Pulse: 63   Neurologic Exam Mental Status: Awake and fully alert. Oriented to place and time. Recent  and remote memory intact. Attention span, concentration and fund of knowledge appropriate. Mood and affect appropriate.  Cranial Nerves: Fundoscopic exam not done. Pupils equal, briskly reactive to light. Extraocular movements full without nystagmus. Visual fields full to confrontation. Hearing intact. Facial sensation intact. Face, tongue, palate moves normally and symmetrically.  Motor: Normal bulk and tone. Normal strength in all tested extremity muscles. Sensory.: intact to touch ,pinprick .position and vibratory sensation.  Coordination: Rapid alternating movements normal in all extremities. Finger-to-nose nonocclusive thrombus and terminal basilar artery secondary to cardiogenic embolism from atrial fibrillation heel-to-shin performed accurately bilaterally. Gait and Station: deferred as walker not brought Reflexes: 1+ and symmetric. Toes downgoing.   NIHSS  0 Modified Rankin  2   ASSESSMENT: 89 y Caucasian lady with left cerebellar infarcts in March 2017 with non-occlusive thrombus of distal basilar artery secondary to  cardiogenic embolism from atrial fibrillation. Vascular risk factors of atrial fibrillation, diabetes, hypertension, hyperlipidemia and age    PLAN: I had a long d/w patient, husband, daughter and son about her recent stroke,atrial fibrillation, basilar artery thrombus, risk for recurrent stroke/TIAs, personally independently reviewed imaging studies and stroke evaluation results and answered questions.Continue Eliquis  for secondary stroke prevention and maintain strict control of hypertension with blood pressure goal below 130/90, diabetes with hemoglobin A1c goal below 6.5% and lipids with LDL cholesterol goal below 70 mg/dL. I also advised the patient to eat a healthy diet with plenty of whole grains, cereals, fruits and vegetables, exercise regularly and maintain ideal body weight. 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 Followup in the future with me in 3 months or call earlier if necessary Greater than 50% of time during this 35 minute visit was spent on counseling,explanation of diagnosis, planning of further management, discussion with patient and family and coordination of care Antony Contras, MD Note: This document was prepared with digital dictation and possible smart phrase technology. Any transcriptional errors that result from this process are unintentional

## 2015-10-28 NOTE — Patient Instructions (Signed)
I had a long d/w patient, husband, daughter and son about her recent stroke,atrial fibrillation, basilar artery thrombus, risk for recurrent stroke/TIAs, personally independently reviewed imaging studies and stroke evaluation results and answered questions.Continue Eliquis  for secondary stroke prevention and maintain strict control of hypertension with blood pressure goal below 130/90, diabetes with hemoglobin A1c goal below 6.5% and lipids with LDL cholesterol goal below 70 mg/dL. I also advised the patient to eat a healthy diet with plenty of whole grains, cereals, fruits and vegetables, exercise regularly and maintain ideal body weight. 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 Followup in the future with me in 3 months or call earlier if necessary Stroke Prevention Some medical conditions and behaviors are associated with an increased chance of having a stroke. You may prevent a stroke by making healthy choices and managing medical conditions. HOW CAN I REDUCE MY RISK OF HAVING A STROKE?   Stay physically active. Get at least 30 minutes of activity on most or all days.  Do not smoke. It may also be helpful to avoid exposure to secondhand smoke.  Limit alcohol use. Moderate alcohol use is considered to be:  No more than 2 drinks per day for men.  No more than 1 drink per day for nonpregnant women.  Eat healthy foods. This involves:  Eating 5 or more servings of fruits and vegetables a day.  Making dietary changes that address high blood pressure (hypertension), high cholesterol, diabetes, or obesity.  Manage your cholesterol levels.  Making food choices that are high in fiber and low in saturated fat, trans fat, and cholesterol may control cholesterol levels.  Take any prescribed medicines to control cholesterol as directed by your health care provider.  Manage your diabetes.  Controlling your  carbohydrate and sugar intake is recommended to manage diabetes.  Take any prescribed medicines to control diabetes as directed by your health care provider.  Control your hypertension.  Making food choices that are low in salt (sodium), saturated fat, trans fat, and cholesterol is recommended to manage hypertension.  Ask your health care provider if you need treatment to lower your blood pressure. Take any prescribed medicines to control hypertension as directed by your health care provider.  If you are 15-61 years of age, have your blood pressure checked every 3-5 years. If you are 90 years of age or older, have your blood pressure checked every year.  Maintain a healthy weight.  Reducing calorie intake and making food choices that are low in sodium, saturated fat, trans fat, and cholesterol are recommended to manage weight.  Stop drug abuse.  Avoid taking birth control pills.  Talk to your health care provider about the risks of taking birth control pills if you are over 67 years old, smoke, get migraines, or have ever had a blood clot.  Get evaluated for sleep disorders (sleep apnea).  Talk to your health care provider about getting a sleep evaluation if you snore a lot or have excessive sleepiness.  Take medicines only as directed by your health care provider.  For some people, aspirin or blood thinners (anticoagulants) are helpful in reducing the risk of forming abnormal blood clots that can lead to stroke. If you have the irregular heart rhythm of atrial fibrillation, you should be on a blood thinner unless there is a good reason you cannot take them.  Understand all your medicine instructions.  Make sure  that other conditions (such as anemia or atherosclerosis) are addressed. SEEK IMMEDIATE MEDICAL CARE IF:   You have sudden weakness or numbness of the face, arm, or leg, especially on one side of the body.  Your face or eyelid droops to one side.  You have sudden  confusion.  You have trouble speaking (aphasia) or understanding.  You have sudden trouble seeing in one or both eyes.  You have sudden trouble walking.  You have dizziness.  You have a loss of balance or coordination.  You have a sudden, severe headache with no known cause.  You have new chest pain or an irregular heartbeat. Any of these symptoms may represent a serious problem that is an emergency. Do not wait to see if the symptoms will go away. Get medical help at once. Call your local emergency services (911 in [place] Do not drive yourself to the hospital.   This information is not intended to replace advice given to you by your health care provider. Make sure you discuss any questions you have with your health care provider.   Document Released: 08/27/2004 Document Revised: 08/10/2014 Document Reviewed: 01/20/2013 Elsevier Interactive Patient Education [employer].

## 2015-10-29 ENCOUNTER — Other Ambulatory Visit: Payer: Self-pay

## 2015-10-29 DIAGNOSIS — I69398 Other sequelae of cerebral infarction: Secondary | ICD-10-CM | POA: Diagnosis not present

## 2015-10-29 DIAGNOSIS — R2689 Other abnormalities of gait and mobility: Secondary | ICD-10-CM | POA: Diagnosis not present

## 2015-10-29 DIAGNOSIS — E119 Type 2 diabetes mellitus without complications: Secondary | ICD-10-CM | POA: Diagnosis not present

## 2015-10-29 DIAGNOSIS — I4891 Unspecified atrial fibrillation: Secondary | ICD-10-CM | POA: Diagnosis not present

## 2015-10-29 DIAGNOSIS — I69314 Frontal lobe and executive function deficit following cerebral infarction: Secondary | ICD-10-CM | POA: Diagnosis not present

## 2015-10-29 DIAGNOSIS — I2510 Atherosclerotic heart disease of native coronary artery without angina pectoris: Secondary | ICD-10-CM | POA: Diagnosis not present

## 2015-10-29 NOTE — Patient Outreach (Signed)
[facility]) Care Management  10/29/2015  Evelyn Collins 1932-08-30 [id number]   SUBJECTIVE:  Telephone call to patient regarding EMMI stroke follow up. HIPAA verified with patient.  Patient tearful when asked how she was doing. Patient states her son is now with her. Patient states her daughter had to go back to her home in [place]. Patient states she misses her daughter. Patient states the social worker from [facility] came out to see her.  Patient states her daughter was able to follow up with the social worker before she went back to [place]. Patient states she saw Dr. Leonie Man on yesterday. Patient denies any changes to her medications. Patient states her calcium was discontinued while she was in the hospital.  Patient states she asked Dr. Leonie Man about starting back on her calcium. Patient states Dr. Leonie Man wanted her to follow up with her primary MD regarding her calcium. Patient states she has not been able to get a follow up appointment with her primary MD.  Patient states she has tried to get an appointment set up since she was discharged from the hospital on 10/22/15 but has been unable to. Patient states she tried to schedule appointment with her primary MD on yesterday. Patient states the office continues to say that the doctor has not been in.  Patient states she is still feeling tired and has the "fuzzy" feeling. Patient states she  Has been crying a lot.  Patient states this was discussed with Dr. Leonie Man. Patient states Dr. Leonie Man want her to follow up with her primary MD for her post stroke depression. Patient states she does not want to go on any medication regarding the depression because she is on so many other medications currently.   Patient denies any new symptoms. Patient states she is scheduled to have physical therapy with [facility] today.  Patient denies any nausea.  Patient states her nausea has completely resolved.  RNCM advised patient to continue to take  her medications as prescribed.  RNCM discussed with patient symptoms of stroke. Patient advised to contact 911 for stroke like symptoms. Patient verbally agreed to follow up call with RNCM. RNCM contacted patients primary MD office and left message with Evelyn Collins for return call.   ASSESSMENT; EMMI stroke transition program.  Patient has been unable to schedule follow up appointment with primary MD.    PLAN; RNCM will contact patients primary MD office to attempt to schedule appointment for patient.  RNCM will follow up with patient within 1 week.   Quinn Plowman RN,BSN,CCM [facility] Telephonic  [phone]

## 2015-10-30 ENCOUNTER — Other Ambulatory Visit: Payer: Self-pay

## 2015-10-30 DIAGNOSIS — I69398 Other sequelae of cerebral infarction: Secondary | ICD-10-CM | POA: Diagnosis not present

## 2015-10-30 DIAGNOSIS — I69314 Frontal lobe and executive function deficit following cerebral infarction: Secondary | ICD-10-CM | POA: Diagnosis not present

## 2015-10-30 DIAGNOSIS — I4891 Unspecified atrial fibrillation: Secondary | ICD-10-CM | POA: Diagnosis not present

## 2015-10-30 DIAGNOSIS — R2689 Other abnormalities of gait and mobility: Secondary | ICD-10-CM | POA: Diagnosis not present

## 2015-10-30 DIAGNOSIS — E119 Type 2 diabetes mellitus without complications: Secondary | ICD-10-CM | POA: Diagnosis not present

## 2015-10-30 DIAGNOSIS — I2510 Atherosclerotic heart disease of native coronary artery without angina pectoris: Secondary | ICD-10-CM | POA: Diagnosis not present

## 2015-10-30 NOTE — Patient Outreach (Signed)
[facility]) Care Management  10/30/2015  Evelyn Collins 04-Dec-1932 [id number]  Received return call/ voice message from [facility] with Dr. Hoover Brunette office. Attempted telephone call to [facility] Unable to reach.  Left voice message for return call.   PLAN; RNCM will attempt telephone outreach to [facility] with Dr. Hoover Brunette office today 10/30/15.  Quinn Plowman RN,BSN,CCM [facility] Telephonic  [phone]

## 2015-10-30 NOTE — Patient Outreach (Signed)
[facility]) Care Management  10/30/2015  Evelyn Collins [birth year] [id number]   Second Telephone call to Dr. Hoover Brunette office.  Voice message left for [facility] requesting return call.  PLAN;  RNCM will await return call from [facility] with Dr. Hoover Brunette office.  If no return call will attempt telephone outreach.  Quinn Plowman RN,BSN,CCM [facility] Telephonic  [phone]

## 2015-11-01 ENCOUNTER — Other Ambulatory Visit: Payer: Self-pay

## 2015-11-01 DIAGNOSIS — I69398 Other sequelae of cerebral infarction: Secondary | ICD-10-CM | POA: Diagnosis not present

## 2015-11-01 DIAGNOSIS — I69314 Frontal lobe and executive function deficit following cerebral infarction: Secondary | ICD-10-CM | POA: Diagnosis not present

## 2015-11-01 DIAGNOSIS — E119 Type 2 diabetes mellitus without complications: Secondary | ICD-10-CM | POA: Diagnosis not present

## 2015-11-01 DIAGNOSIS — R2689 Other abnormalities of gait and mobility: Secondary | ICD-10-CM | POA: Diagnosis not present

## 2015-11-01 DIAGNOSIS — I4891 Unspecified atrial fibrillation: Secondary | ICD-10-CM | POA: Diagnosis not present

## 2015-11-01 DIAGNOSIS — I2510 Atherosclerotic heart disease of native coronary artery without angina pectoris: Secondary | ICD-10-CM | POA: Diagnosis not present

## 2015-11-01 NOTE — Patient Outreach (Signed)
[facility]) Care Management  11/01/2015  Evelyn Collins 1933-02-16 [id number]   SUBJECTIVE; Telephone call to patient.  HIPAA verified with patient. Patient states her son was able to schedule follow up appointment with her primary MD office. Patient states she has an appointment with the physician assistant for Monday, November 04, 2015.  Patient agreed to next follow up appointment with [facility].   PLAN:  RNCM will follow up with patient within 1 week.   Quinn Plowman RN,BSN,CCM [facility] Telephonic  [phone]

## 2015-11-02 DIAGNOSIS — I69314 Frontal lobe and executive function deficit following cerebral infarction: Secondary | ICD-10-CM | POA: Diagnosis not present

## 2015-11-02 DIAGNOSIS — I69398 Other sequelae of cerebral infarction: Secondary | ICD-10-CM | POA: Diagnosis not present

## 2015-11-02 DIAGNOSIS — I4891 Unspecified atrial fibrillation: Secondary | ICD-10-CM | POA: Diagnosis not present

## 2015-11-02 DIAGNOSIS — R2689 Other abnormalities of gait and mobility: Secondary | ICD-10-CM | POA: Diagnosis not present

## 2015-11-02 DIAGNOSIS — I2510 Atherosclerotic heart disease of native coronary artery without angina pectoris: Secondary | ICD-10-CM | POA: Diagnosis not present

## 2015-11-02 DIAGNOSIS — E119 Type 2 diabetes mellitus without complications: Secondary | ICD-10-CM | POA: Diagnosis not present

## 2015-11-04 DIAGNOSIS — I2510 Atherosclerotic heart disease of native coronary artery without angina pectoris: Secondary | ICD-10-CM | POA: Diagnosis not present

## 2015-11-04 DIAGNOSIS — E119 Type 2 diabetes mellitus without complications: Secondary | ICD-10-CM | POA: Diagnosis not present

## 2015-11-04 DIAGNOSIS — I69314 Frontal lobe and executive function deficit following cerebral infarction: Secondary | ICD-10-CM | POA: Diagnosis not present

## 2015-11-04 DIAGNOSIS — R2689 Other abnormalities of gait and mobility: Secondary | ICD-10-CM | POA: Diagnosis not present

## 2015-11-04 DIAGNOSIS — I69398 Other sequelae of cerebral infarction: Secondary | ICD-10-CM | POA: Diagnosis not present

## 2015-11-04 DIAGNOSIS — I4891 Unspecified atrial fibrillation: Secondary | ICD-10-CM | POA: Diagnosis not present

## 2015-11-05 DIAGNOSIS — I69314 Frontal lobe and executive function deficit following cerebral infarction: Secondary | ICD-10-CM | POA: Diagnosis not present

## 2015-11-05 DIAGNOSIS — R2689 Other abnormalities of gait and mobility: Secondary | ICD-10-CM | POA: Diagnosis not present

## 2015-11-05 DIAGNOSIS — I69398 Other sequelae of cerebral infarction: Secondary | ICD-10-CM | POA: Diagnosis not present

## 2015-11-05 DIAGNOSIS — E119 Type 2 diabetes mellitus without complications: Secondary | ICD-10-CM | POA: Diagnosis not present

## 2015-11-05 DIAGNOSIS — I2510 Atherosclerotic heart disease of native coronary artery without angina pectoris: Secondary | ICD-10-CM | POA: Diagnosis not present

## 2015-11-05 DIAGNOSIS — I4891 Unspecified atrial fibrillation: Secondary | ICD-10-CM | POA: Diagnosis not present

## 2015-11-06 ENCOUNTER — Other Ambulatory Visit: Payer: Self-pay

## 2015-11-06 DIAGNOSIS — I4891 Unspecified atrial fibrillation: Secondary | ICD-10-CM | POA: Diagnosis not present

## 2015-11-06 DIAGNOSIS — E119 Type 2 diabetes mellitus without complications: Secondary | ICD-10-CM | POA: Diagnosis not present

## 2015-11-06 DIAGNOSIS — I2510 Atherosclerotic heart disease of native coronary artery without angina pectoris: Secondary | ICD-10-CM | POA: Diagnosis not present

## 2015-11-06 DIAGNOSIS — I69398 Other sequelae of cerebral infarction: Secondary | ICD-10-CM | POA: Diagnosis not present

## 2015-11-06 DIAGNOSIS — R2689 Other abnormalities of gait and mobility: Secondary | ICD-10-CM | POA: Diagnosis not present

## 2015-11-06 DIAGNOSIS — I69314 Frontal lobe and executive function deficit following cerebral infarction: Secondary | ICD-10-CM | POA: Diagnosis not present

## 2015-11-06 NOTE — Patient Outreach (Signed)
[facility]) Care Management  11/06/2015  Evelyn Collins 23-May-1933 [id number]   SUBJECTIVE: Telephone call to patient regarding EMMI stroke program follow up.  HIPAA verified with patient. Patient states she is doing ok.  Patient reports her blood pressures have been doing well.  Patient reports most recent blood pressures were 103/70 and 115/70.  Patient states she checks her blood pressures daily and records. Patient states she continues to take all of her medications as prescribed.  Patient states she still has emotional highs and lows. Patient states her afternoons are better than her mornings and states some mornings are better than others. Patient states both her primary care provider and neurologist are aware of the depression and have explained to her that this can be contributed to having the stroke.  Patient states she has been offered to be placed on an antidepressant but states she does not want to do that now. Patient states she is wanting to deal with it on her own for now.  Patient states her home physical therapy ended on yesterday.  Patient states home speech therapy will see her 1-2 times to help with the occasional slurring of her speech and occupational therapy may see her 1-2 times to help her with meal preparation.   Patient states her son left approximately a week ago. Patient states her husband is with her for support. Patient states she was told she could drive but states she is still not comfortable with that yet.  RNCM advised patient to continue to follow up with her doctor appointments. RNCM advised patient to continue to take her medications as prescribed. RNCM advised patient to call her doctor for any unusual symptoms. Patient states she is aware of how to contact her doctor after hours if needed.  RNCM reviewed signs/symptoms of stroke with patient and advised patient to call 911 for stroke like symptoms.  RNCM advised patient to continue to monitor and  record her blood pressure and take readings to her doctor appointments.  Patient verbally agreed with closure to EMMI stroke program due to no having no further nursing needs. Patient confirms contact phone number for this RNCM for any additional questions or concerns.   PLAN:  RNCM will close patient due to needs being met and no additional nursing needs noted. RNCM will notify patients primary MD of closure.  Quinn Plowman RN,BSN,CCM [facility] Telephonic  [phone]

## 2015-11-07 DIAGNOSIS — R2689 Other abnormalities of gait and mobility: Secondary | ICD-10-CM | POA: Diagnosis not present

## 2015-11-07 DIAGNOSIS — I69314 Frontal lobe and executive function deficit following cerebral infarction: Secondary | ICD-10-CM | POA: Diagnosis not present

## 2015-11-07 DIAGNOSIS — I2510 Atherosclerotic heart disease of native coronary artery without angina pectoris: Secondary | ICD-10-CM | POA: Diagnosis not present

## 2015-11-07 DIAGNOSIS — E119 Type 2 diabetes mellitus without complications: Secondary | ICD-10-CM | POA: Diagnosis not present

## 2015-11-07 DIAGNOSIS — I69398 Other sequelae of cerebral infarction: Secondary | ICD-10-CM | POA: Diagnosis not present

## 2015-11-07 DIAGNOSIS — I4891 Unspecified atrial fibrillation: Secondary | ICD-10-CM | POA: Diagnosis not present

## 2015-11-08 DIAGNOSIS — I4891 Unspecified atrial fibrillation: Secondary | ICD-10-CM | POA: Diagnosis not present

## 2015-11-08 DIAGNOSIS — I2510 Atherosclerotic heart disease of native coronary artery without angina pectoris: Secondary | ICD-10-CM | POA: Diagnosis not present

## 2015-11-08 DIAGNOSIS — R2689 Other abnormalities of gait and mobility: Secondary | ICD-10-CM | POA: Diagnosis not present

## 2015-11-08 DIAGNOSIS — E119 Type 2 diabetes mellitus without complications: Secondary | ICD-10-CM | POA: Diagnosis not present

## 2015-11-08 DIAGNOSIS — I69398 Other sequelae of cerebral infarction: Secondary | ICD-10-CM | POA: Diagnosis not present

## 2015-11-08 DIAGNOSIS — I69314 Frontal lobe and executive function deficit following cerebral infarction: Secondary | ICD-10-CM | POA: Diagnosis not present

## 2015-11-11 DIAGNOSIS — R2689 Other abnormalities of gait and mobility: Secondary | ICD-10-CM | POA: Diagnosis not present

## 2015-11-11 DIAGNOSIS — I69314 Frontal lobe and executive function deficit following cerebral infarction: Secondary | ICD-10-CM | POA: Diagnosis not present

## 2015-11-11 DIAGNOSIS — I69398 Other sequelae of cerebral infarction: Secondary | ICD-10-CM | POA: Diagnosis not present

## 2015-11-11 DIAGNOSIS — E119 Type 2 diabetes mellitus without complications: Secondary | ICD-10-CM | POA: Diagnosis not present

## 2015-11-11 DIAGNOSIS — I2510 Atherosclerotic heart disease of native coronary artery without angina pectoris: Secondary | ICD-10-CM | POA: Diagnosis not present

## 2015-11-11 DIAGNOSIS — I4891 Unspecified atrial fibrillation: Secondary | ICD-10-CM | POA: Diagnosis not present

## 2015-11-14 DIAGNOSIS — I4891 Unspecified atrial fibrillation: Secondary | ICD-10-CM | POA: Diagnosis not present

## 2015-11-14 DIAGNOSIS — R2689 Other abnormalities of gait and mobility: Secondary | ICD-10-CM | POA: Diagnosis not present

## 2015-11-14 DIAGNOSIS — E119 Type 2 diabetes mellitus without complications: Secondary | ICD-10-CM | POA: Diagnosis not present

## 2015-11-14 DIAGNOSIS — I2510 Atherosclerotic heart disease of native coronary artery without angina pectoris: Secondary | ICD-10-CM | POA: Diagnosis not present

## 2015-11-14 DIAGNOSIS — I69398 Other sequelae of cerebral infarction: Secondary | ICD-10-CM | POA: Diagnosis not present

## 2015-11-14 DIAGNOSIS — I69314 Frontal lobe and executive function deficit following cerebral infarction: Secondary | ICD-10-CM | POA: Diagnosis not present

## 2015-11-19 DIAGNOSIS — I69314 Frontal lobe and executive function deficit following cerebral infarction: Secondary | ICD-10-CM | POA: Diagnosis not present

## 2015-11-19 DIAGNOSIS — I2510 Atherosclerotic heart disease of native coronary artery without angina pectoris: Secondary | ICD-10-CM | POA: Diagnosis not present

## 2015-11-19 DIAGNOSIS — I69398 Other sequelae of cerebral infarction: Secondary | ICD-10-CM | POA: Diagnosis not present

## 2015-11-19 DIAGNOSIS — I4891 Unspecified atrial fibrillation: Secondary | ICD-10-CM | POA: Diagnosis not present

## 2015-11-19 DIAGNOSIS — R2689 Other abnormalities of gait and mobility: Secondary | ICD-10-CM | POA: Diagnosis not present

## 2015-11-19 DIAGNOSIS — E119 Type 2 diabetes mellitus without complications: Secondary | ICD-10-CM | POA: Diagnosis not present

## 2015-11-20 DIAGNOSIS — Z6826 Body mass index (BMI) 26.0-26.9, adult: Secondary | ICD-10-CM | POA: Diagnosis not present

## 2015-11-20 DIAGNOSIS — I4891 Unspecified atrial fibrillation: Secondary | ICD-10-CM | POA: Diagnosis not present

## 2015-11-20 DIAGNOSIS — I651 Occlusion and stenosis of basilar artery: Secondary | ICD-10-CM | POA: Diagnosis not present

## 2015-11-20 DIAGNOSIS — F329 Major depressive disorder, single episode, unspecified: Secondary | ICD-10-CM | POA: Diagnosis not present

## 2015-11-20 DIAGNOSIS — I10 Essential (primary) hypertension: Secondary | ICD-10-CM | POA: Diagnosis not present

## 2015-11-20 DIAGNOSIS — E119 Type 2 diabetes mellitus without complications: Secondary | ICD-10-CM | POA: Diagnosis not present

## 2015-11-20 DIAGNOSIS — I639 Cerebral infarction, unspecified: Secondary | ICD-10-CM | POA: Diagnosis not present

## 2015-11-27 DIAGNOSIS — E785 Hyperlipidemia, unspecified: Secondary | ICD-10-CM | POA: Diagnosis not present

## 2015-11-27 DIAGNOSIS — I359 Nonrheumatic aortic valve disorder, unspecified: Secondary | ICD-10-CM | POA: Diagnosis not present

## 2015-11-27 DIAGNOSIS — G459 Transient cerebral ischemic attack, unspecified: Secondary | ICD-10-CM | POA: Diagnosis not present

## 2015-11-27 DIAGNOSIS — E119 Type 2 diabetes mellitus without complications: Secondary | ICD-10-CM | POA: Diagnosis not present

## 2015-11-27 DIAGNOSIS — Z950 Presence of cardiac pacemaker: Secondary | ICD-10-CM | POA: Diagnosis not present

## 2015-11-27 DIAGNOSIS — I2510 Atherosclerotic heart disease of native coronary artery without angina pectoris: Secondary | ICD-10-CM | POA: Diagnosis not present

## 2015-11-27 DIAGNOSIS — I350 Nonrheumatic aortic (valve) stenosis: Secondary | ICD-10-CM | POA: Diagnosis not present

## 2015-11-27 DIAGNOSIS — Z952 Presence of prosthetic heart valve: Secondary | ICD-10-CM | POA: Diagnosis not present

## 2015-12-02 DIAGNOSIS — I350 Nonrheumatic aortic (valve) stenosis: Secondary | ICD-10-CM | POA: Diagnosis not present

## 2015-12-02 DIAGNOSIS — I4891 Unspecified atrial fibrillation: Secondary | ICD-10-CM | POA: Diagnosis not present

## 2015-12-02 DIAGNOSIS — G459 Transient cerebral ischemic attack, unspecified: Secondary | ICD-10-CM | POA: Diagnosis not present

## 2015-12-02 DIAGNOSIS — Z952 Presence of prosthetic heart valve: Secondary | ICD-10-CM | POA: Diagnosis not present

## 2015-12-02 DIAGNOSIS — I2510 Atherosclerotic heart disease of native coronary artery without angina pectoris: Secondary | ICD-10-CM | POA: Diagnosis not present

## 2015-12-02 DIAGNOSIS — E784 Other hyperlipidemia: Secondary | ICD-10-CM | POA: Diagnosis not present

## 2015-12-02 DIAGNOSIS — Z6826 Body mass index (BMI) 26.0-26.9, adult: Secondary | ICD-10-CM | POA: Diagnosis not present

## 2015-12-02 DIAGNOSIS — E785 Hyperlipidemia, unspecified: Secondary | ICD-10-CM | POA: Diagnosis not present

## 2015-12-02 DIAGNOSIS — I359 Nonrheumatic aortic valve disorder, unspecified: Secondary | ICD-10-CM | POA: Diagnosis not present

## 2015-12-02 DIAGNOSIS — I10 Essential (primary) hypertension: Secondary | ICD-10-CM | POA: Diagnosis not present

## 2015-12-02 DIAGNOSIS — Z950 Presence of cardiac pacemaker: Secondary | ICD-10-CM | POA: Diagnosis not present

## 2015-12-02 DIAGNOSIS — I638 Other cerebral infarction: Secondary | ICD-10-CM | POA: Diagnosis not present

## 2015-12-02 DIAGNOSIS — E119 Type 2 diabetes mellitus without complications: Secondary | ICD-10-CM | POA: Diagnosis not present

## 2015-12-10 DIAGNOSIS — Z6826 Body mass index (BMI) 26.0-26.9, adult: Secondary | ICD-10-CM | POA: Diagnosis not present

## 2015-12-10 DIAGNOSIS — I10 Essential (primary) hypertension: Secondary | ICD-10-CM | POA: Diagnosis not present

## 2015-12-10 DIAGNOSIS — E119 Type 2 diabetes mellitus without complications: Secondary | ICD-10-CM | POA: Diagnosis not present

## 2015-12-10 DIAGNOSIS — I4891 Unspecified atrial fibrillation: Secondary | ICD-10-CM | POA: Diagnosis not present

## 2015-12-12 DIAGNOSIS — H401131 Primary open-angle glaucoma, bilateral, mild stage: Secondary | ICD-10-CM | POA: Diagnosis not present

## 2015-12-24 ENCOUNTER — Telehealth: Payer: Self-pay | Admitting: *Deleted

## 2015-12-24 ENCOUNTER — Other Ambulatory Visit: Payer: Self-pay | Admitting: Neurology

## 2015-12-24 DIAGNOSIS — I651 Occlusion and stenosis of basilar artery: Secondary | ICD-10-CM

## 2015-12-24 NOTE — Telephone Encounter (Signed)
Rn call patient about CT angiogram. Pt receive a call from Dr. Nonie Hoyer office about a CT angiogram. Pt stated Dr. Nonie Hoyer office told her to call our office.Pt has no orders or referrals in the system for the procedure. PT wants to know if she needs to have this Ct angiogram. Also patient stated at the last office visit, Dr.Sethi told her he did not recommend her having a massage. Pt wants to know if she can ever have any kind of massage or never have a massage ever again. Rn stated a message was sent to [place]. Pt verbalized understanding.

## 2015-12-24 NOTE — Telephone Encounter (Signed)
I spoke to the patient and ordered CT and a gram of the brain which Dr. Estanislado Pandy wands. Advise him to avoid any massage which involve sudden jerking of the neck but she can have deep tissue massage if her neck is held stationary

## 2015-12-24 NOTE — Telephone Encounter (Signed)
error 

## 2015-12-24 NOTE — Telephone Encounter (Signed)
Message For: [facility]                  Taken 23-MAY-17 at  2:32PM by BBB ------------------------------------------------------------ Evelyn Collins               CID [id number]  Patient SAME                 Pt's Dr Leonie Man        Area Code 336 Phone# [phone] * DOB [phone]     RE RECV'D CALL THIS AM FROM ANOTHER DR'S OFC RE:A    CT SCAN FOR FOLLOW UP ON STROKE/SHE WASNT AWARE.PCB  Disp:Y/N N If Y = C/B If No Response In 31minutes ============================================================

## 2015-12-26 ENCOUNTER — Other Ambulatory Visit (HOSPITAL_COMMUNITY): Payer: Self-pay | Admitting: Interventional Radiology

## 2015-12-26 ENCOUNTER — Telehealth: Payer: Self-pay | Admitting: Neurology

## 2015-12-26 DIAGNOSIS — I639 Cerebral infarction, unspecified: Secondary | ICD-10-CM

## 2015-12-26 NOTE — Telephone Encounter (Signed)
Rn call patient back about if she was going to have her CT angio on tomorrow or June 2018. Pts husband stated she was not at home. Pt still has appt schedule in June 2017 for same test.

## 2015-12-26 NOTE — Telephone Encounter (Signed)
Rn call patient and talk to her and her son on the phone. Pt has a CT angio schedule for May 26 at [facility] and June 26 at another location. Pt stated she is going out of town on Monday. Rn stated to son and pt they need to call Dr.Devenshwar office to find out if the test on 01/27/2016 can wait or does she need to have it done on 12/27/2015. Pt has two test schedule in the same month. The pt will call the office for clarification.

## 2015-12-26 NOTE — Telephone Encounter (Signed)
Patient called and requested to speak with the nurse regarding CT scan and some questions she has about having it done. Please call and advise.

## 2015-12-27 ENCOUNTER — Other Ambulatory Visit: Payer: Self-pay

## 2016-01-03 ENCOUNTER — Telehealth: Payer: Self-pay | Admitting: Neurology

## 2016-01-03 NOTE — Telephone Encounter (Signed)
I spoke to the patient's daughter Joelene Millin who is a Forensic psychologist in [place]. The patient's husband has advanced Alzheimer's dementia and family is concerned about the patient's physical and psychological health and are suggesting change in living situation which I feel is appropriate. She called to just let me know in case the patient's husband accompanies her for subsequent visits to be supportive in stating so

## 2016-01-03 NOTE — Telephone Encounter (Signed)
Pt's daughter's employee , Minette Brine called in on behalf on daughter Evelyn Collins, requesting a call from physician. Please call [phone]

## 2016-01-20 DIAGNOSIS — F4323 Adjustment disorder with mixed anxiety and depressed mood: Secondary | ICD-10-CM | POA: Diagnosis not present

## 2016-01-24 DIAGNOSIS — E119 Type 2 diabetes mellitus without complications: Secondary | ICD-10-CM | POA: Diagnosis not present

## 2016-01-24 DIAGNOSIS — M810 Age-related osteoporosis without current pathological fracture: Secondary | ICD-10-CM | POA: Diagnosis not present

## 2016-01-24 DIAGNOSIS — E784 Other hyperlipidemia: Secondary | ICD-10-CM | POA: Diagnosis not present

## 2016-01-27 ENCOUNTER — Ambulatory Visit (HOSPITAL_COMMUNITY): Payer: Medicare Other

## 2016-01-27 ENCOUNTER — Other Ambulatory Visit (HOSPITAL_COMMUNITY): Payer: Self-pay

## 2016-01-27 ENCOUNTER — Ambulatory Visit (HOSPITAL_COMMUNITY)
Admission: RE | Admit: 2016-01-27 | Discharge: 2016-01-27 | Disposition: A | Discharge status: Home / Self Care | Payer: Medicare Other | Source: Ambulatory Visit | Attending: Interventional Radiology | Admitting: Interventional Radiology

## 2016-01-27 ENCOUNTER — Ambulatory Visit (HOSPITAL_COMMUNITY): Payer: No Typology Code available for payment source

## 2016-01-31 DIAGNOSIS — Z6826 Body mass index (BMI) 26.0-26.9, adult: Secondary | ICD-10-CM | POA: Diagnosis not present

## 2016-01-31 DIAGNOSIS — E784 Other hyperlipidemia: Secondary | ICD-10-CM | POA: Diagnosis not present

## 2016-01-31 DIAGNOSIS — Z1389 Encounter for screening for other disorder: Secondary | ICD-10-CM | POA: Diagnosis not present

## 2016-01-31 DIAGNOSIS — I10 Essential (primary) hypertension: Secondary | ICD-10-CM | POA: Diagnosis not present

## 2016-01-31 DIAGNOSIS — I4891 Unspecified atrial fibrillation: Secondary | ICD-10-CM | POA: Diagnosis not present

## 2016-01-31 DIAGNOSIS — I639 Cerebral infarction, unspecified: Secondary | ICD-10-CM | POA: Diagnosis not present

## 2016-01-31 DIAGNOSIS — E119 Type 2 diabetes mellitus without complications: Secondary | ICD-10-CM | POA: Diagnosis not present

## 2016-01-31 DIAGNOSIS — Z0000 Encounter for general adult medical examination without abnormal findings: Secondary | ICD-10-CM | POA: Diagnosis not present

## 2016-01-31 DIAGNOSIS — Z7189 Other specified counseling: Secondary | ICD-10-CM | POA: Diagnosis not present

## 2016-01-31 DIAGNOSIS — I350 Nonrheumatic aortic (valve) stenosis: Secondary | ICD-10-CM | POA: Diagnosis not present

## 2016-01-31 DIAGNOSIS — F330 Major depressive disorder, recurrent, mild: Secondary | ICD-10-CM | POA: Diagnosis not present

## 2016-01-31 DIAGNOSIS — I25118 Atherosclerotic heart disease of native coronary artery with other forms of angina pectoris: Secondary | ICD-10-CM | POA: Diagnosis not present

## 2016-02-10 ENCOUNTER — Telehealth: Payer: Self-pay | Admitting: Neurology

## 2016-02-10 NOTE — Telephone Encounter (Signed)
Rn call patient back about her dizzy spells. Pt stated she did see her PCP last week for a physical. Pt stated the dizzy spells stated on Friday. Pt stated it happen a couple to times when she was shopping and at other events. Rn stated in her appts , she cancel her Ct angio head/neck. Pt stated someone told her she could not have the test if she flew. Rn ask patient was it Dr. Darlin Coco office that call. Pt stated she is not sure. Pt stated her memory is not too great. Rn advise her that Dr.Sethi will not be in the office this week. He will be back next week. Pt has an appt next Wednesday for follow up. Rn advise her to seek the ED or PCP, Urgent care if her dizzy spells get worse. Rn advise her to call Dr.Deveshwar office about scheduling the test. Pt verbalized understanding.

## 2016-02-10 NOTE — Telephone Encounter (Signed)
Patient is calling and states she has been being very dizzy the last 4 or 5 days especially when she stands up.  Please call.

## 2016-02-11 NOTE — Telephone Encounter (Signed)
Okay but also get CT angiogram done if possible prior to that visit

## 2016-02-11 NOTE — Telephone Encounter (Signed)
LFt vm for Evelyn Collins pts daughter about pt r/s her ct angio head and neck

## 2016-02-12 ENCOUNTER — Encounter: Payer: Self-pay | Admitting: Neurology

## 2016-02-12 NOTE — Telephone Encounter (Signed)
PER appts pts daughter schedule Ct angio head neck on 02/17/2016.

## 2016-02-13 DIAGNOSIS — F4323 Adjustment disorder with mixed anxiety and depressed mood: Secondary | ICD-10-CM | POA: Diagnosis not present

## 2016-02-17 ENCOUNTER — Encounter (HOSPITAL_COMMUNITY): Payer: Self-pay

## 2016-02-17 ENCOUNTER — Ambulatory Visit (HOSPITAL_COMMUNITY)
Admission: RE | Admit: 2016-02-17 | Discharge: 2016-02-17 | Disposition: A | Discharge status: Home / Self Care | Payer: Medicare Other | Source: Ambulatory Visit | Attending: Interventional Radiology | Admitting: Interventional Radiology

## 2016-02-17 DIAGNOSIS — I6501 Occlusion and stenosis of right vertebral artery: Secondary | ICD-10-CM | POA: Diagnosis not present

## 2016-02-17 DIAGNOSIS — I639 Cerebral infarction, unspecified: Secondary | ICD-10-CM | POA: Insufficient documentation

## 2016-02-17 DIAGNOSIS — R42 Dizziness and giddiness: Secondary | ICD-10-CM | POA: Diagnosis not present

## 2016-02-17 DIAGNOSIS — G9389 Other specified disorders of brain: Secondary | ICD-10-CM | POA: Diagnosis not present

## 2016-02-17 LAB — CREATININE, SERUM
CREATININE: 0.88 mg/dL (ref 0.44–1.00)
GFR calc Af Amer: >60 mL/min (ref >60)
GFR calc non Af Amer: 60 mL/min — ABNORMAL LOW (ref >60)

## 2016-02-17 MED ORDER — IOPAMIDOL (ISOVUE-370) INJECTION 76%
INTRAVENOUS | Status: AC
  Administered 2016-02-17: 50 mL
  Filled 2016-02-17: qty 50

## 2016-02-18 IMAGING — CR DG CHEST 2V
2 series · 2 of 2 positions shown · non-contrast
Comparison: DG CHEST 2 VIEW dated 08/09/2012

CLINICAL DATA: Preoperative for inguinal hernia repair ; history of
previous aortic valve replacement, hypertension, and diabetes

EXAM:
CHEST  2 VIEW

[w chest pa]
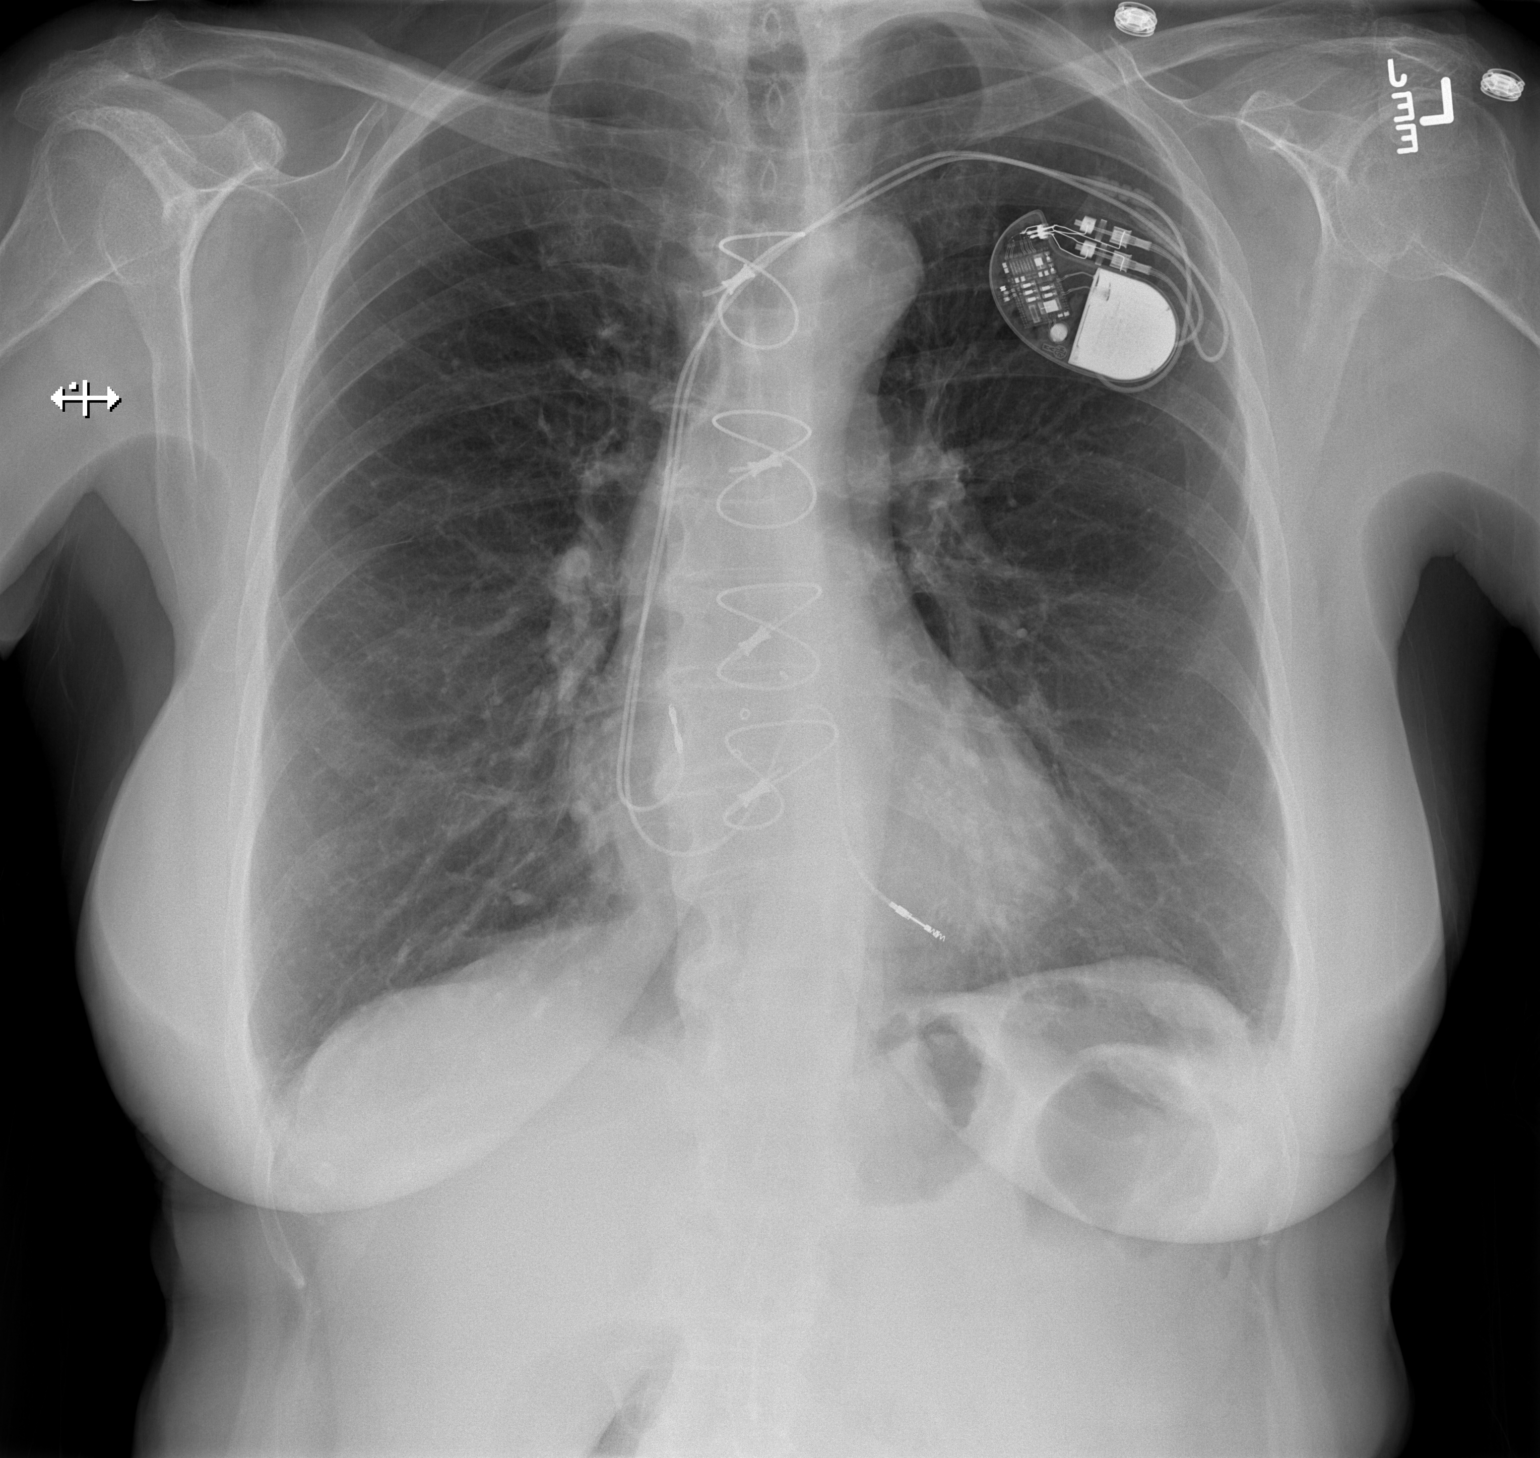

[w chest lat]
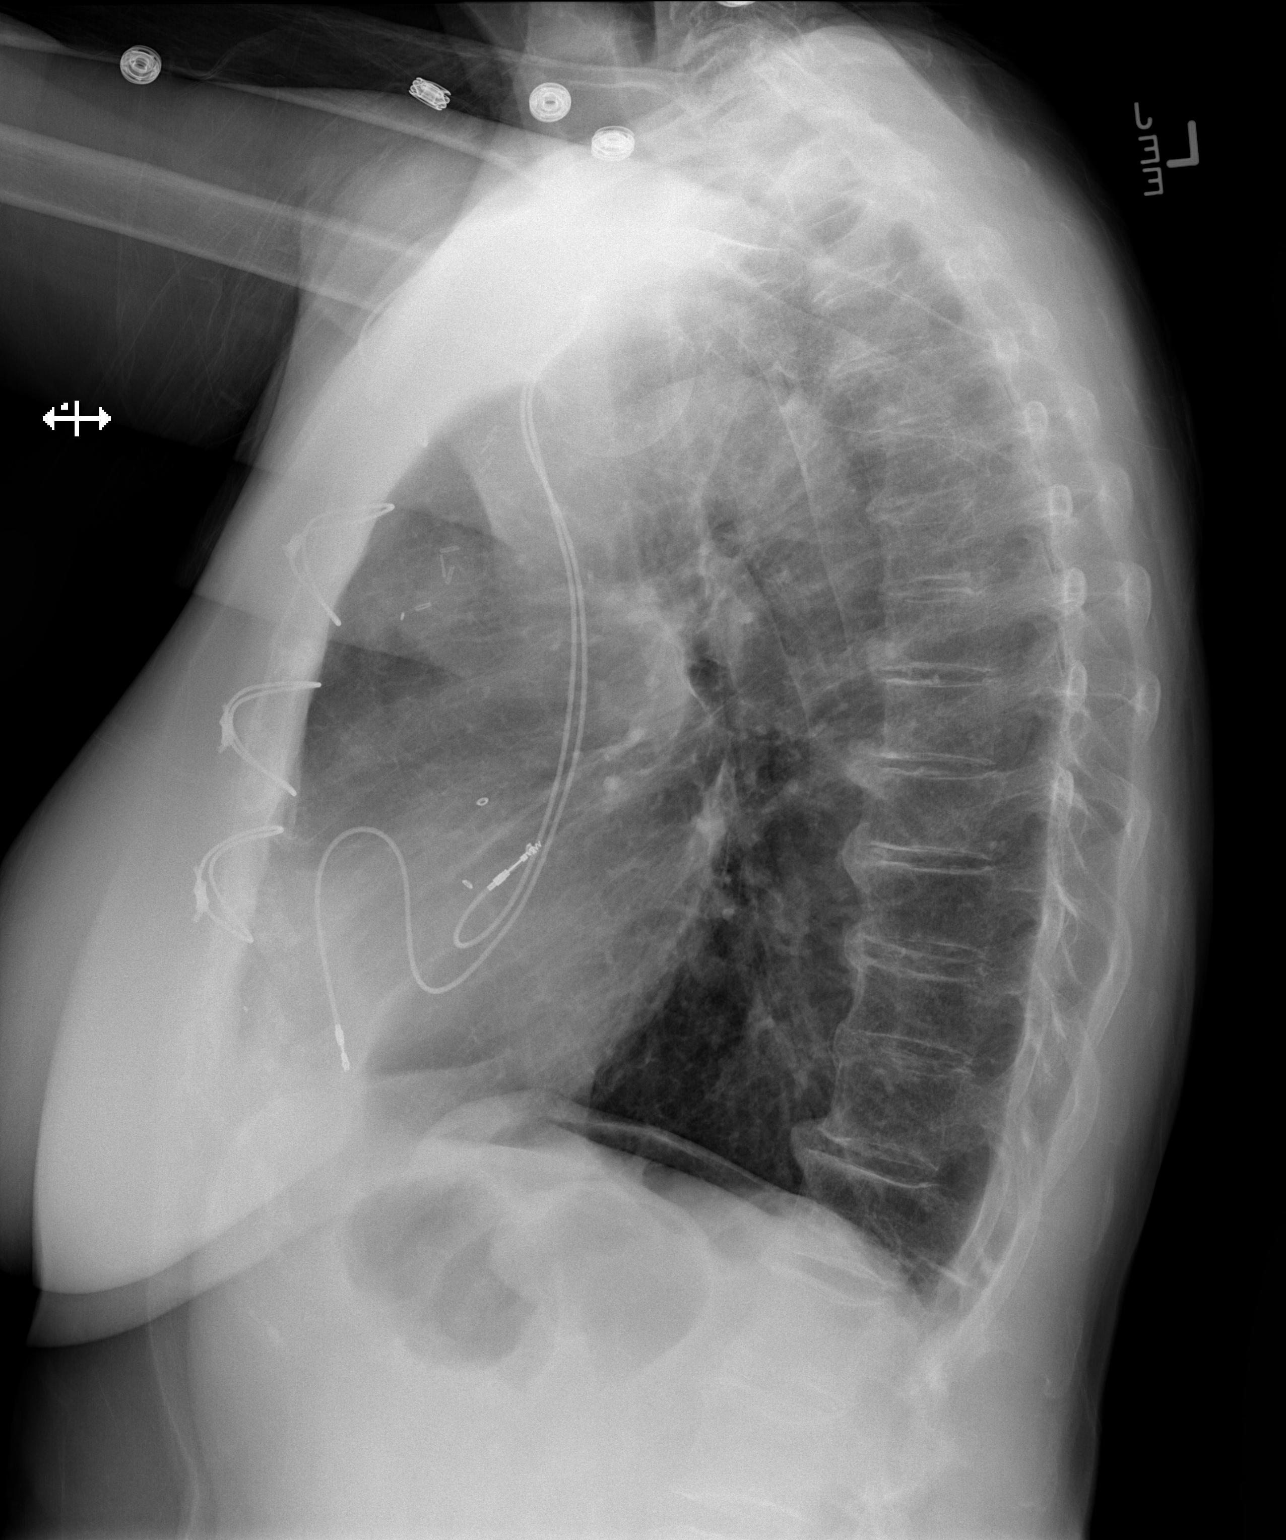

[2 of 2 positions shown; findings below may reference images not displayed]

FINDINGS: The lungs are mildly hyperinflated. There is no focal infiltrate.
The cardiac silhouette is normal in size. The pulmonary vascularity
is not engorged. A permanent pacemaker is in place and in reasonable
position radiographically. There are four intact sternal wires
present.
IMPRESSION: There is hyperinflation consistent with COPD. There is no evidence
of CHF or pneumonia.

## 2016-02-19 ENCOUNTER — Encounter: Payer: Self-pay | Admitting: Neurology

## 2016-02-19 ENCOUNTER — Ambulatory Visit (INDEPENDENT_AMBULATORY_CARE_PROVIDER_SITE_OTHER): Payer: Medicare Other | Admitting: Neurology

## 2016-02-19 VITALS — BP 138/81 | HR 60 | Wt 160.0 lb

## 2016-02-19 DIAGNOSIS — I651 Occlusion and stenosis of basilar artery: Secondary | ICD-10-CM | POA: Diagnosis not present

## 2016-02-19 DIAGNOSIS — R42 Dizziness and giddiness: Secondary | ICD-10-CM

## 2016-02-19 NOTE — Telephone Encounter (Signed)
Patient is being seen today with Dr.Sethi per office visit.

## 2016-02-19 NOTE — Patient Instructions (Signed)
I had a long d/w patient and daughter about her recent stroke, risk for recurrent stroke/TIAs, personally independently reviewed imaging studies and stroke evaluation results and answered questions.Continue Eliquis (apixaban) daily  for secondary stroke prevention and maintain strict control of hypertension with blood pressure goal below 130/90, diabetes with hemoglobin A1c goal below 6.5% and lipids with LDL cholesterol goal below 70 mg/dL. I also advised the patient to do orthostatic tolerance exercises before she gets up and to drink adequate fluids and not get dehydrated. I also advised her to do regular activities for stress relaxation to help with her underlying anxiety. Followup in the future with stroke nurse practitioner in 6 months or call earlier if necessary.

## 2016-02-19 NOTE — Progress Notes (Signed)
[facility] [address] [place]. [place] [address] [phone]       OFFICE FOLLOW-UP NOTE  [name] Date of Birth:  [birth year] Medical Record Number:  [id number]   HPI: 53 year Caucasian lady seen today for first office follow-up following hospital admission for stroke. This was an urgent visit requested by the patient and family members due to multiple questions about his recent stroke. Patient is accompanied by her husband, son and daughter were present for this visit throughout. Evelyn Collins is an 82 y.o. female patient who presented to the emergency room with symptoms of dizziness and gait problems. She went to bed at 2:30 AM  On 3/19/17which is her last known normal. After she woke up this morning she walked she feet and felt dizzy and unstable. She reported falling more to the right side at that time. She'll brought into the emergency room by her daughter for further evaluation. Patient denies any vision or speech problems. There is some concern for possible slurred speech initially but patient is not sure. Denies any focal weakness in upper or lower extremities no new sensory problems. She has past medical history of A. fibrillation diagnosis in the past and was on anticoagulation prior to 2011 for a couple of years. She discontinued due to her own preference and has been on aspirin 81 mg daily since then. She also has pacemaker, and I did call replacement history, history of stroke in 2010 with some right-sided weakness which has resolved. Date last known well: 03/19 /2017 Time last known well: 2.30 am tPA Given: No: Outside tPA window.   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 Patient had catheter angiogram performed by Dr. Estanislado Pandy which showed nonocclusive filling defect probably  a thrombus in the distal basilar artery with patency of the proximal portions of both posterior cerebral arteries. Right vertebral artery is occluded at C5-6 segment. The patient was not felt to be a candidate for endovascular intervention. LDL cholesterol of 65. Hemoglobin A1c was 7.0. Patient had a permanent pacemaker and hence MRI could not be performed. Transversely echo showed normal ejection fraction. Patient was previously on Coumadin for several years for A. fib but this had been discontinued by her primary care physician and she was aspirin at the time of the stroke. She was started on eliquis for secondary stroke prevention. She states she's done well she is having only minor bruising but no bleeding episodes. She is currently at home and home physical therapy has not yet been started but they had received some phone calls. Family had a lot of questions about what equipment the patient will need and water therapy needs are  Update 02/19/2016 :  She returns for follow-up after last visit 4 months ago. She is a complaint by her daughter. Patient is under a lot of stress due to her husband's Alzheimer's which seem to be getting worse. Patient states she had an episode last night when she was up on a computer looking for several hours she felt she could no longer concentrated and felt "" I'm going to die. States this feeling lasted for about an hour she tried to reach out and call out to her husband but was unable to do so. She is unable to get up and walk. She complains of orthostatic dizziness.  Describes a   feeling of ill health and feeling slightly off balance though she denies vertigo and nausea vomiting. She has brought along her blood pressure log which clearly stated blood pressure has been running from 100-110 range with occasional 120 to 1:30 range. She admits she's been under significant stress and she is having some short-term memory difficulties. She had a follow-up CT angiogram done on 02/7116  which I have reviewed and shows resolution of the partial clot in the distal basilar artery which is no longer seen. 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 She admits that she has not been drinking enough fluids and may be dehydrated. She has done a lot of reading into eliquis and possible side effects and   discussed with me   alternative medications. ROS:   14 system review of systems is positive for  hearing loss, runny nose, eye itching, cough, leg swelling, abdominal pain, constipation, nausea, restless leg, acting out dreams, frequency of urination, muscle cramps, rash, itching, easy bruising and bleeding, memory loss, dizziness, confusion, decreased concentration and all other systems negative  PMH:  Past Medical History  Diagnosis Date  . H/O aortic valve replacement   . Inguinal hernia   . Hyperlipidemia   . Diabetes mellitus without complication (HCC)   . Glaucoma   . Hypertension   . Allergy to pollen   . Pacemaker   . Coronary artery disease   . Asthma   . Constipation   . Arthritis   . Stroke [facility]) 2010ish    no residual  . Glaucoma     Social History:  Social History   Social History  . Marital Status: Married    Spouse Name: N/A  . Number of Children: N/A  . Years of Education: N/A   Occupational History  . Not on file.   Social History Main Topics  . Smoking status: Never Smoker   . Smokeless tobacco: Never Used  . Alcohol Use: No  . Drug Use: No  . Sexual Activity: Not on file   Other Topics Concern  . Not on file   Social History Narrative    Medications:   Current Outpatient Prescriptions on File Prior to Visit  Medication Sig Dispense Refill  . ACCU-CHEK FASTCLIX LANCETS MISC by Does not apply route.    Marland Kitchen albuterol (PROVENTIL HFA;VENTOLIN HFA) 108 (90 BASE) MCG/ACT inhaler Inhale 2 puffs into the lungs every 4 (four) hours as needed for shortness of breath (cough). Reported on 10/25/2015    . albuterol  (PROVENTIL) (2.5 MG/3ML) 0.083% nebulizer solution Take 2.5 mg by nebulization every 6 (six) hours as needed for wheezing or shortness of breath. Reported on 10/25/2015    . alendronate (FOSAMAX) 70 MG tablet Take 70 mg by mouth every Monday. Take with a full glass of water on an empty stomach.    Marland Kitchen apixaban (ELIQUIS) 5 MG TABS tablet Take 1 tablet (5 mg total) by mouth 2 (two) times daily. 60 tablet 0  . Cholecalciferol (VITAMIN D) 2000 UNITS CAPS Take 5,000 Units by mouth every morning.     . docusate sodium (COLACE) 100 MG capsule Take 100 mg by mouth 2 (two) times daily.    . dorzolamide-timolol (COSOPT) 22.3-6.8 MG/ML ophthalmic solution Place 1 drop into both eyes 2 (two) times daily.    . Glucos-Chondroit-Hyaluron-MSM (GLUCOSAMINE CHONDROITIN JOINT PO) Take 1 tablet by mouth 2 (two) times daily.     Marland Kitchen latanoprost (XALATAN) 0.005 % ophthalmic solution  Place 1 drop into both eyes at bedtime.    Marland Kitchen loratadine-pseudoephedrine (CLARITIN-D 12-HOUR) 5-120 MG tablet Take 1 tablet by mouth 2 (two) times daily as needed.     Marland Kitchen losartan (COZAAR) 25 MG tablet Take 0.5 tablets (12.5 mg total) by mouth every morning. (Patient taking differently: Take 25 mg by mouth every morning. Take one daily) 30 tablet 0  . metFORMIN (GLUCOPHAGE) 1000 MG tablet Take 1,000 mg by mouth 2 (two) times daily with a meal.    . Multiple Vitamin (MULTIVITAMIN WITH MINERALS) TABS tablet Take 1 tablet by mouth every other day.    . Omega-3 Fatty Acids (FISH OIL) 1200 MG CAPS Take 1,200 mg by mouth every morning.    . rosuvastatin (CRESTOR) 20 MG tablet Take 10-20 mg by mouth every evening. Alternates taking 0.5 tablet and 1 tablet daily    . vitamin C (ASCORBIC ACID) 500 MG tablet Take 500 mg by mouth daily.     No current facility-administered medications on file prior to visit.    Allergies:   Allergies  Allergen Reactions  . Other     Eye drop for glaucoma cannot remember name - gave her palpitations    Physical  Exam General: well developed, well nourished Caucasian lady, seated, in no evident distress Head: head normocephalic and atraumatic.  Neck: supple with no carotid or supraclavicular bruits Cardiovascular: regular rate and rhythm, no murmurs Musculoskeletal: no deformity Skin:  no rash/petichiae Vascular:  Normal pulses all extremities Filed Vitals:   02/19/16 1342  BP: 138/81  Pulse: 60   Neurologic Exam Mental Status: Awake and fully alert. Oriented to place and time. Recent and remote memory intact. Attention span, concentration and fund of knowledge appropriate. Mood and affect appropriate.  Cranial Nerves: Fundoscopic exam not done. Pupils equal, briskly reactive to light. Extraocular movements full without nystagmus. Visual fields full to confrontation. Hearing intact. Facial sensation intact. Face, tongue, palate moves normally and symmetrically.  Motor: Normal bulk and tone. Normal strength in all tested extremity muscles. Sensory.: intact to touch ,pinprick .position and vibratory sensation.  Coordination: Rapid alternating movements normal in all extremities. Finger-to-nose nonocclusive thrombus and terminal basilar artery secondary to cardiogenic embolism from atrial fibrillation heel-to-shin performed accurately bilaterally. Gait and Station: Slightly broad based. Unable to walk tandem. Reflexes: 1+ and symmetric. Toes downgoing.   NIHSS  0 Modified Rankin  2   ASSESSMENT: 70 y Caucasian lady with left cerebellar infarcts in March 2017 with non-occlusive thrombus of distal basilar artery secondary to cardiogenic embolism from atrial fibrillation. Vascular risk factors of atrial fibrillation, diabetes, hypertension, hyperlipidemia and age    PLAN: I had a long d/w patient and daughter about her recent stroke, risk for recurrent stroke/TIAs, personally independently reviewed imaging studies and stroke evaluation results and answered questions.Continue Eliquis (apixaban) daily   for secondary stroke prevention and maintain strict control of hypertension with blood pressure goal below 130/90, diabetes with hemoglobin A1c goal below 6.5% and lipids with LDL cholesterol goal below 70 mg/dL. I also advised the patient to do orthostatic tolerance exercises before she gets up and to drink adequate fluids and not get dehydrated. I also advised her to do regular activities for stress relaxation to help with her underlying anxiety. Followup in the future with stroke nurse practitioner in 6 months or call earlier if necessary. Greater than 50% of time during this 25 minute visit was spent on counseling,explanation of diagnosis, planning of further management, discussion with patient and family and coordination  of care Antony Contras, MD Note: This document was prepared with digital dictation and possible smart phrase technology. Any transcriptional errors that result from this process are unintentional

## 2016-02-26 ENCOUNTER — Ambulatory Visit (HOSPITAL_COMMUNITY): Payer: No Typology Code available for payment source

## 2016-02-26 DIAGNOSIS — Z952 Presence of prosthetic heart valve: Secondary | ICD-10-CM | POA: Diagnosis not present

## 2016-02-26 DIAGNOSIS — R42 Dizziness and giddiness: Secondary | ICD-10-CM | POA: Diagnosis not present

## 2016-02-26 DIAGNOSIS — E785 Hyperlipidemia, unspecified: Secondary | ICD-10-CM | POA: Diagnosis not present

## 2016-02-26 DIAGNOSIS — I350 Nonrheumatic aortic (valve) stenosis: Secondary | ICD-10-CM | POA: Diagnosis not present

## 2016-02-26 DIAGNOSIS — G459 Transient cerebral ischemic attack, unspecified: Secondary | ICD-10-CM | POA: Diagnosis not present

## 2016-02-26 DIAGNOSIS — Z950 Presence of cardiac pacemaker: Secondary | ICD-10-CM | POA: Diagnosis not present

## 2016-02-26 DIAGNOSIS — I2510 Atherosclerotic heart disease of native coronary artery without angina pectoris: Secondary | ICD-10-CM | POA: Diagnosis not present

## 2016-02-26 DIAGNOSIS — I359 Nonrheumatic aortic valve disorder, unspecified: Secondary | ICD-10-CM | POA: Diagnosis not present

## 2016-02-26 DIAGNOSIS — I10 Essential (primary) hypertension: Secondary | ICD-10-CM | POA: Diagnosis not present

## 2016-02-26 DIAGNOSIS — E119 Type 2 diabetes mellitus without complications: Secondary | ICD-10-CM | POA: Diagnosis not present

## 2016-03-03 DIAGNOSIS — Z952 Presence of prosthetic heart valve: Secondary | ICD-10-CM | POA: Diagnosis not present

## 2016-03-03 DIAGNOSIS — I359 Nonrheumatic aortic valve disorder, unspecified: Secondary | ICD-10-CM | POA: Diagnosis not present

## 2016-03-03 DIAGNOSIS — I350 Nonrheumatic aortic (valve) stenosis: Secondary | ICD-10-CM | POA: Diagnosis not present

## 2016-03-03 DIAGNOSIS — R42 Dizziness and giddiness: Secondary | ICD-10-CM | POA: Diagnosis not present

## 2016-03-03 DIAGNOSIS — E119 Type 2 diabetes mellitus without complications: Secondary | ICD-10-CM | POA: Diagnosis not present

## 2016-03-03 DIAGNOSIS — E785 Hyperlipidemia, unspecified: Secondary | ICD-10-CM | POA: Diagnosis not present

## 2016-03-03 DIAGNOSIS — I10 Essential (primary) hypertension: Secondary | ICD-10-CM | POA: Diagnosis not present

## 2016-03-03 DIAGNOSIS — I2510 Atherosclerotic heart disease of native coronary artery without angina pectoris: Secondary | ICD-10-CM | POA: Diagnosis not present

## 2016-03-03 DIAGNOSIS — Z950 Presence of cardiac pacemaker: Secondary | ICD-10-CM | POA: Diagnosis not present

## 2016-03-03 DIAGNOSIS — G459 Transient cerebral ischemic attack, unspecified: Secondary | ICD-10-CM | POA: Diagnosis not present

## 2016-03-05 DIAGNOSIS — F4323 Adjustment disorder with mixed anxiety and depressed mood: Secondary | ICD-10-CM | POA: Diagnosis not present

## 2016-03-06 DIAGNOSIS — L03031 Cellulitis of right toe: Secondary | ICD-10-CM | POA: Diagnosis not present

## 2016-03-06 DIAGNOSIS — Z6826 Body mass index (BMI) 26.0-26.9, adult: Secondary | ICD-10-CM | POA: Diagnosis not present

## 2016-03-11 ENCOUNTER — Encounter: Payer: Self-pay | Admitting: Podiatry

## 2016-03-11 ENCOUNTER — Ambulatory Visit (INDEPENDENT_AMBULATORY_CARE_PROVIDER_SITE_OTHER): Payer: Medicare Other | Admitting: Podiatry

## 2016-03-11 VITALS — BP 133/69 | HR 70 | Resp 14

## 2016-03-11 DIAGNOSIS — I651 Occlusion and stenosis of basilar artery: Secondary | ICD-10-CM

## 2016-03-11 DIAGNOSIS — L600 Ingrowing nail: Secondary | ICD-10-CM

## 2016-03-11 DIAGNOSIS — L03032 Cellulitis of left toe: Secondary | ICD-10-CM

## 2016-03-11 NOTE — Progress Notes (Signed)
   Subjective:    Patient ID: Evelyn Collins, female    DOB: [birth year], 82 y.o.   MRN: [id number]  HPI this patient presents to the office with chief complaint of pain noted at the tip of the outside border big toe, right foot. She says she was at Dr. Ardeth Perfect last Friday, who found pus  to be present in the nail. He then told her to make an appointment for an evaluation and treatment in this office. She says she has been soaking her foot in Epsom salts since last Friday. She is diabetic and also had a stroke on which she is taking eliquiss. She presents for an evaluation and treatment of this condition    Review of Systems  All other systems reviewed and are negative.      Objective:   Physical Exam GENERAL APPEARANCE: Alert, conversant. Appropriately groomed. No acute distress.  VASCULAR: Pedal pulses are  palpable at  [facility] and PT bilateral.  Capillary refill time is immediate to all digits,  Normal temperature gradient.  Digital hair growth is present bilateral  NEUROLOGIC: sensation is normal to 5.07 monofilament at 5/5 sites bilateral.  Light touch is intact bilateral, Muscle strength normal.  MUSCULOSKELETAL: acceptable muscle strength, tone and stability bilateral.  Intrinsic muscluature intact bilateral. HAV deformity with hammer toe second left.  HAV 1st MPJ right.   DERMATOLOGIC: skin color, texture, and turgor are within normal limits.  No preulcerative lesions or ulcers  are seen, no interdigital maceration noted.  No open lesions present.  . No drainage noted.  NAILS  Marked incurvation noted big toe lateral border.  The lateral nail border has hematoma noted.  Pincer toenails B/L         Assessment & Plan:  Ingrown toenail leateral border right great toe.  Paronychia right great toe.  IE  Excision distal nail lateral border right great toenail.  Neosporin/DSD.  RTC 3 months for nail care.

## 2016-03-24 ENCOUNTER — Telehealth (HOSPITAL_COMMUNITY): Payer: Self-pay

## 2016-03-24 NOTE — Telephone Encounter (Signed)
Pt agreed to f/u in 6 months with a CTA. AW

## 2016-04-07 DIAGNOSIS — R42 Dizziness and giddiness: Secondary | ICD-10-CM | POA: Diagnosis not present

## 2016-04-07 DIAGNOSIS — I2510 Atherosclerotic heart disease of native coronary artery without angina pectoris: Secondary | ICD-10-CM | POA: Diagnosis not present

## 2016-04-07 DIAGNOSIS — G459 Transient cerebral ischemic attack, unspecified: Secondary | ICD-10-CM | POA: Diagnosis not present

## 2016-04-07 DIAGNOSIS — I350 Nonrheumatic aortic (valve) stenosis: Secondary | ICD-10-CM | POA: Diagnosis not present

## 2016-04-07 DIAGNOSIS — I10 Essential (primary) hypertension: Secondary | ICD-10-CM | POA: Diagnosis not present

## 2016-04-07 DIAGNOSIS — E785 Hyperlipidemia, unspecified: Secondary | ICD-10-CM | POA: Diagnosis not present

## 2016-04-07 DIAGNOSIS — E119 Type 2 diabetes mellitus without complications: Secondary | ICD-10-CM | POA: Diagnosis not present

## 2016-04-07 DIAGNOSIS — Z952 Presence of prosthetic heart valve: Secondary | ICD-10-CM | POA: Diagnosis not present

## 2016-04-07 DIAGNOSIS — I359 Nonrheumatic aortic valve disorder, unspecified: Secondary | ICD-10-CM | POA: Diagnosis not present

## 2016-04-07 DIAGNOSIS — Z950 Presence of cardiac pacemaker: Secondary | ICD-10-CM | POA: Diagnosis not present

## 2016-04-16 DIAGNOSIS — F4323 Adjustment disorder with mixed anxiety and depressed mood: Secondary | ICD-10-CM | POA: Diagnosis not present

## 2016-05-05 DIAGNOSIS — Z6826 Body mass index (BMI) 26.0-26.9, adult: Secondary | ICD-10-CM | POA: Diagnosis not present

## 2016-05-05 DIAGNOSIS — J329 Chronic sinusitis, unspecified: Secondary | ICD-10-CM | POA: Diagnosis not present

## 2016-05-05 DIAGNOSIS — I4891 Unspecified atrial fibrillation: Secondary | ICD-10-CM | POA: Diagnosis not present

## 2016-05-05 DIAGNOSIS — E119 Type 2 diabetes mellitus without complications: Secondary | ICD-10-CM | POA: Diagnosis not present

## 2016-05-05 DIAGNOSIS — R0789 Other chest pain: Secondary | ICD-10-CM | POA: Diagnosis not present

## 2016-05-05 DIAGNOSIS — I10 Essential (primary) hypertension: Secondary | ICD-10-CM | POA: Diagnosis not present

## 2016-05-05 DIAGNOSIS — E784 Other hyperlipidemia: Secondary | ICD-10-CM | POA: Diagnosis not present

## 2016-05-26 DIAGNOSIS — E119 Type 2 diabetes mellitus without complications: Secondary | ICD-10-CM | POA: Diagnosis not present

## 2016-05-26 DIAGNOSIS — I10 Essential (primary) hypertension: Secondary | ICD-10-CM | POA: Diagnosis not present

## 2016-05-26 DIAGNOSIS — Z952 Presence of prosthetic heart valve: Secondary | ICD-10-CM | POA: Diagnosis not present

## 2016-05-26 DIAGNOSIS — Z950 Presence of cardiac pacemaker: Secondary | ICD-10-CM | POA: Diagnosis not present

## 2016-05-26 DIAGNOSIS — I2510 Atherosclerotic heart disease of native coronary artery without angina pectoris: Secondary | ICD-10-CM | POA: Diagnosis not present

## 2016-05-26 DIAGNOSIS — R42 Dizziness and giddiness: Secondary | ICD-10-CM | POA: Diagnosis not present

## 2016-05-26 DIAGNOSIS — G459 Transient cerebral ischemic attack, unspecified: Secondary | ICD-10-CM | POA: Diagnosis not present

## 2016-06-11 DIAGNOSIS — H401122 Primary open-angle glaucoma, left eye, moderate stage: Secondary | ICD-10-CM | POA: Diagnosis not present

## 2016-06-11 DIAGNOSIS — H401111 Primary open-angle glaucoma, right eye, mild stage: Secondary | ICD-10-CM | POA: Diagnosis not present

## 2016-06-11 DIAGNOSIS — E119 Type 2 diabetes mellitus without complications: Secondary | ICD-10-CM | POA: Diagnosis not present

## 2016-07-02 ENCOUNTER — Encounter: Payer: Self-pay | Admitting: Podiatry

## 2016-07-02 ENCOUNTER — Ambulatory Visit (INDEPENDENT_AMBULATORY_CARE_PROVIDER_SITE_OTHER): Payer: Medicare Other | Admitting: Podiatry

## 2016-07-02 VITALS — Ht 67.0 in | Wt 159.0 lb

## 2016-07-02 DIAGNOSIS — L608 Other nail disorders: Secondary | ICD-10-CM | POA: Diagnosis not present

## 2016-07-02 DIAGNOSIS — L600 Ingrowing nail: Secondary | ICD-10-CM

## 2016-07-02 DIAGNOSIS — B351 Tinea unguium: Secondary | ICD-10-CM | POA: Diagnosis not present

## 2016-07-02 DIAGNOSIS — M79676 Pain in unspecified toe(s): Secondary | ICD-10-CM

## 2016-07-02 NOTE — Progress Notes (Signed)
   Subjective:    Patient ID: Evelyn Collins, female    DOB: April 19, 1933, 82 y.o.   MRN: [id number]  HPI this patient presents to the office with chief complaint of pain noted at the tip of the outside border big toe, right foot. She says her big toenails both feet are painful.  . She is diabetic and also had a stroke on which she is taking eliquiss. She presents for an evaluation and treatment of this condition    Review of Systems  All other systems reviewed and are negative.      Objective:   Physical Exam GENERAL APPEARANCE: Alert, conversant. Appropriately groomed. No acute distress.  VASCULAR: Pedal pulses are  palpable at  [facility] and PT bilateral.  Capillary refill time is immediate to all digits,  Normal temperature gradient.  Digital hair growth is present bilateral  NEUROLOGIC: sensation is normal to 5.07 monofilament at 5/5 sites bilateral.  Light touch is intact bilateral, Muscle strength normal.  MUSCULOSKELETAL: acceptable muscle strength, tone and stability bilateral.  Intrinsic muscluature intact bilateral. HAV deformity with hammer toe second left.  HAV 1st MPJ right.   DERMATOLOGIC: skin color, texture, and turgor are within normal limits.  No preulcerative lesions or ulcers  are seen, no interdigital maceration noted.  No open lesions present.  . No drainage noted.  NAILS  Marked incurvation medial and lateral borders both great toe. Pincer toenails B/L         Assessment & Plan:  Ingrown toenail leateral border right great toe.  Onychomycosis  Hallux  B/L  Debridement of nails  B/L.  RTC 3 months for nail care.   Gardiner Barefoot DPM

## 2016-07-20 DIAGNOSIS — Z23 Encounter for immunization: Secondary | ICD-10-CM | POA: Diagnosis not present

## 2016-08-07 DIAGNOSIS — H409 Unspecified glaucoma: Secondary | ICD-10-CM | POA: Diagnosis not present

## 2016-08-07 DIAGNOSIS — I10 Essential (primary) hypertension: Secondary | ICD-10-CM | POA: Diagnosis not present

## 2016-08-07 DIAGNOSIS — E119 Type 2 diabetes mellitus without complications: Secondary | ICD-10-CM | POA: Diagnosis not present

## 2016-08-07 DIAGNOSIS — Z6826 Body mass index (BMI) 26.0-26.9, adult: Secondary | ICD-10-CM | POA: Diagnosis not present

## 2016-08-10 ENCOUNTER — Telehealth (HOSPITAL_COMMUNITY): Payer: Self-pay

## 2016-08-10 NOTE — Telephone Encounter (Signed)
Called to schedule f/u ct scan. Pt would like to wait until after her appt with neurologist. She will call back to schedule. AW

## 2016-08-24 ENCOUNTER — Encounter: Payer: Self-pay | Admitting: Nurse Practitioner

## 2016-08-24 ENCOUNTER — Ambulatory Visit (INDEPENDENT_AMBULATORY_CARE_PROVIDER_SITE_OTHER): Payer: Medicare Other | Admitting: Nurse Practitioner

## 2016-08-24 VITALS — BP 138/88 | HR 75 | Ht 67.0 in | Wt 161.4 lb

## 2016-08-24 DIAGNOSIS — I10 Essential (primary) hypertension: Secondary | ICD-10-CM | POA: Diagnosis not present

## 2016-08-24 DIAGNOSIS — I6330 Cerebral infarction due to thrombosis of unspecified cerebral artery: Secondary | ICD-10-CM

## 2016-08-24 DIAGNOSIS — E785 Hyperlipidemia, unspecified: Secondary | ICD-10-CM | POA: Diagnosis not present

## 2016-08-24 NOTE — Patient Instructions (Signed)
Continue eliquis for secondary stroke prevention  Maintain blood pressure 130/90 or less, today's reading 138/88 continue antihypertensive medications. Diabetes with hemoglobin A1c below 6.5 continue diabetic medication LDL cholesterol below 70 continue Crestor Stay well hydrated to prevent dehydration Continue walking routine daily, this is also good for your stress reduction Memory score is stable Follow-up in 6 months

## 2016-08-24 NOTE — Progress Notes (Signed)
[facility]  PATIENT: Evelyn Collins DOB: 06-12-1933   REASON FOR VISIT:Follow-up for history of stroke with symptoms of dizziness and gait problems with risk factors of  diabetes hypertension and hyperlipidemia in atrial fibrillation   HISTORY FROM:patient  and daughter    HISTORY OF PRESENT ILLNESS: HISTORY PS18 year Caucasian lady seen today for first office follow-up following hospital admission for stroke. This was an urgent visit requested by the patient and family members due to multiple questions about his recent stroke. Patient is accompanied by her husband, son and daughter were present for this visit throughout. Evelyn Collins is an 83 y.o. female patient who presented to the emergency room with symptoms of dizziness and gait problems. She went to bed at 2:30 AM  On 3/19/17which is her last known normal. After she woke up this morning she walked she feet and felt dizzy and unstable. She reported falling more to the right side at that time. She'll brought into the emergency room by her daughter for further evaluation. Patient denies any vision or speech problems. There is some concern for possible slurred speech initially but patient is not sure. Denies any focal weakness in upper or lower extremities no new sensory problems. She has past medical history of A. fibrillation diagnosis in the past and was on anticoagulation prior to 2011 for a couple of years. She discontinued due to her own preference and has been on aspirin 81 mg daily since then. She also has pacemaker, and I did call replacement history, history of stroke in 2010 with some right-sided weakness which has resolved. Date last known well: 03/19 /2017 Time last known well: 2.30 am tPA Given: No: Outside tPA window.   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 Patient had catheter angiogram performed by Dr. Estanislado Pandy which showed nonocclusive filling defect probably a thrombus in the distal basilar artery with patency of the proximal portions of both posterior cerebral arteries. Right vertebral artery is occluded at C5-6 segment. The patient was not felt to be a candidate for endovascular intervention. LDL cholesterol of 65. Hemoglobin A1c was 7.0. Patient had a permanent pacemaker and hence MRI could not be performed. Transversely echo showed normal ejection fraction. Patient was previously on Coumadin for several years for A. fib but this had been discontinued by her primary care physician and she was aspirin at the time of the stroke. She was started on eliquis for secondary stroke prevention. She states she's done well she is having only minor bruising but no bleeding episodes. She is currently at home and home physical therapy has not yet been started but they had received some phone calls. Family had a lot of questions about what equipment the patient will need and water therapy needs are  Update 02/19/2016 PS:  She returns for follow-up after last visit 4 months ago. She is with her daughter. Patient is under a lot of stress due to her husband's Alzheimer's which seem to be getting worse. Patient states she had an episode last night when she was up on a computer looking for several hours she felt she could no longer concentrated and felt I'm going to die. States this feeling lasted for about an hour she tried to reach out and call out to her husband but was unable to do so.  She is unable to get up and walk. She complains of orthostatic dizziness. Describes a   feeling of ill health and feeling slightly off balance though she denies vertigo and nausea vomiting. She has brought along her blood pressure log which clearly stated blood pressure has been running from 100-110 range with occasional 120 to 130 range. She admits she's been  under significant stress and she is having some short-term memory difficulties. She had a follow-up CT angiogram done on 02/7116 which I have reviewed and shows resolution of the partial clot in the distal basilar artery which is no longer seen. 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 She admits that she has not been drinking enough fluids and may be dehydrated. She has done a lot of reading into eliquis and possible side effects and   discussed with me   alternative medications. UPDATE  01/22/2018CM, Evelyn Collins,  83 year old female returns for follow-up.  She has a history of stroke event in March 2017.She continues to complain of some dizziness today however she admits to not drinking enough fluids usually just a glass of water daily.  Her dizziness is not related to positional changes.She's had some adjustments to her blood pressure medications with improvement  Of her primary care provider. She has progressed pressure log today for me to review. Blood pressure in the office today 138/88. She remains on eliquis for secondary stroke prevention. She is also on Crestor lipid management. She denies any myalgias.  She also has a history of diabetes and she claims her most recent A1c was 6.4 She has gotten very little exercise over the last month or so due to the weather however she has walked the last couple of days and feels that that has helped her mood. She has a permanent pacemaker and her cardiologist is Dr. Wynonia Lawman. She thinks her memory is worse however Mini-Mental status exam is 30 out of 30. She returns for reevaluation  REVIEW OF SYSTEMS: Full 14 system review of systems performed and notable only for those listed, all others are neg:  Constitutional: neg  Cardiovascular: neg Ear/Nose/Throat: Hearing loss  Skin: neg Eyes:  Blurred vision Respiratory: neg Gastroitestinal:  Urinary urgency Hematology/Lymphatic: neg  Endocrine: neg Musculoskeletal: joint  pains Allergy/Immunology: neg Neurological:  Memory loss and occasional dizziness Psychiatric:  depressionSleep : neg   ALLERGIES: Allergies  Allergen Reactions  . Other     Eye drop for glaucoma cannot remember name - gave her palpitations    HOME MEDICATIONS: Outpatient Medications Prior to Visit  Medication Sig Dispense Refill  . ACCU-CHEK FASTCLIX LANCETS MISC by Does not apply route.    Marland Kitchen albuterol (PROVENTIL HFA;VENTOLIN HFA) 108 (90 BASE) MCG/ACT inhaler Inhale 2 puffs into the lungs every 4 (four) hours as needed for shortness of breath (cough). Reported on 10/25/2015    . alendronate (FOSAMAX) 70 MG tablet Take 70 mg by mouth every Monday. Take with a full glass of water on an empty stomach.    Marland Kitchen apixaban (ELIQUIS) 5 MG TABS tablet Take 1 tablet (5 mg total) by mouth 2 (two) times daily. 60 tablet 0  . Cholecalciferol (VITAMIN D) 2000 UNITS CAPS Take 5,000 Units by mouth every morning.     . docusate sodium (COLACE) 100 MG capsule Take 100 mg by mouth 2 (two) times daily.    . dorzolamide-timolol (COSOPT) 22.3-6.8 MG/ML ophthalmic solution Place 1 drop into both eyes 2 (two) times daily.    . Glucos-Chondroit-Hyaluron-MSM (GLUCOSAMINE  CHONDROITIN JOINT PO) Take 1 tablet by mouth 2 (two) times daily.     Marland Kitchen latanoprost (XALATAN) 0.005 % ophthalmic solution Place 1 drop into both eyes at bedtime.    Marland Kitchen loratadine-pseudoephedrine (CLARITIN-D 12-HOUR) 5-120 MG tablet Take 1 tablet by mouth 2 (two) times daily as needed.     Marland Kitchen losartan (COZAAR) 25 MG tablet Take 0.5 tablets (12.5 mg total) by mouth every morning. (Patient taking differently: Take 25 mg by mouth every morning. Take one daily) 30 tablet 0  . metFORMIN (GLUCOPHAGE) 1000 MG tablet Take 1,000 mg by mouth 2 (two) times daily with a meal.    . Multiple Vitamin (MULTIVITAMIN WITH MINERALS) TABS tablet Take 1 tablet by mouth every other day.    . Omega-3 Fatty Acids (FISH OIL) 1200 MG CAPS Take 1,200 mg by mouth every morning.     . rosuvastatin (CRESTOR) 20 MG tablet Take 10-20 mg by mouth every evening. Alternates taking 0.5 tablet and 1 tablet daily    . albuterol (PROVENTIL) (2.5 MG/3ML) 0.083% nebulizer solution Take 2.5 mg by nebulization every 6 (six) hours as needed for wheezing or shortness of breath. Reported on 10/25/2015    . vitamin C (ASCORBIC ACID) 500 MG tablet Take 500 mg by mouth daily.     No facility-administered medications prior to visit.     PAST MEDICAL HISTORY: Past Medical History:  Diagnosis Date  . Allergy to pollen   . Arthritis   . Asthma   . Constipation   . Coronary artery disease   . Diabetes mellitus without complication ([place])   . Glaucoma   . Glaucoma   . H/O aortic valve replacement   . Hyperlipidemia   . Hypertension   . Inguinal hernia   . Pacemaker   . Stroke [facility]) 2010ish   no residual    PAST SURGICAL HISTORY: Past Surgical History:  Procedure Laterality Date  . AORTIC VALVE REPLACEMENT  2006  . CORONARY ARTERY BYPASS GRAFT  2006  . EXPLORATION POST OPERATIVE OPEN HEART    . EYE SURGERY Bilateral    cataract  . HERNIA REPAIR     umbilical hernia x2  . INGUINAL HERNIA REPAIR Right 12/21/2013   Procedure: HERNIA REPAIR INGUINAL ADULT;  Surgeon: Odis Hollingshead, MD;  Location: [facility];  Service: General;  Laterality: Right;  . INSERTION OF MESH Right 12/21/2013   Procedure: INSERTION OF MESH;  Surgeon: Odis Hollingshead, MD;  Location: [facility];  Service: General;  Laterality: Right;  . RADIOLOGY WITH ANESTHESIA N/A 10/20/2015   Procedure: RADIOLOGY WITH ANESTHESIA;  Surgeon: Luanne Bras, MD;  Location: [facility];  Service: Radiology;  Laterality: N/A;  . TONSILLECTOMY      FAMILY HISTORY: No family history on file.  SOCIAL HISTORY: Social History   Social History  . Marital status: Married    Spouse name: N/A  . Number of children: N/A  . Years of education: N/A   Occupational History  . Not on file.   Social History Main Topics  . Smoking  status: Never Smoker  . Smokeless tobacco: Never Used  . Alcohol use No  . Drug use: No  . Sexual activity: Not on file   Other Topics Concern  . Not on file   Social History Narrative  . No narrative on file     PHYSICAL EXAM  Vitals:   08/24/16 1509  BP: 138/88  Pulse: 75  Weight: 161 lb 6.4 oz (73.2 kg)  Height: 5'  7" (1.702 m)   Body mass index is 25.28 kg/m.  Generalized: Well developed, in no acute distress  Head: normocephalic and atraumatic,. Oropharynx benign  Neck: Supple, no carotid bruits  Cardiac: Regular rate rhythm, no murmur  Musculoskeletal: No deformity   Neurological examination   Mentation: Alert oriented to time, place, history taking. Attention span and concentration appropriate. Recent and remote memory intact.  Follows all commands speech and language fluent. MMSE - Mini Mental State Exam 08/24/2016  Orientation to time 5  Orientation to Place 5  Registration 3  Attention/ Calculation 5  Recall 3  Language- name 2 objects 2  Language- repeat 1  Language- follow 3 step command 3  Language- read & follow direction 1  Write a sentence 1  Copy design 1  Total score 30     Cranial nerve II-XII: .Pupils were equal round reactive to light extraocular movements were full, visual field were full on confrontational test. Facial sensation and strength were normal. hearing was intact to finger rubbing bilaterally. Uvula tongue midline. head turning and shoulder shrug were normal and symmetric.Tongue protrusion into cheek strength was normal. Motor: normal bulk and tone, full strength in the BUE, BLE, fine finger movements normal, no pronator drift. No focal weakness Sensory: normal and symmetric to light touch, pinprick, and  Vibration,in the upper and lower extremities Coordination: finger-nose-finger, heel-to-shin bilaterally, no dysmetria Reflexes: 1+ upper lower and symmetric, plantar responses were flexor bilaterally. Gait and Station: Rising up  from seated position without assistance, normal stance,  moderate stride, good arm swing, smooth turning, able to perform tiptoe, and heel walking without difficulty. Tandem gait is mildly unsteady. No assistive device.   DIAGNOSTIC DATA (LABS, IMAGING, TESTING) - I reviewed patient records, labs, notes, testing and imaging myself where available.  Lab Results  Component Value Date   WBC 9.4 10/22/2015   HGB 11.2 (L) 10/22/2015   HCT 35.9 (L) 10/22/2015   MCV 86.5 10/22/2015   PLT 141 (L) 10/22/2015      Component Value Date/Time   NA 142 10/22/2015 0519   K 3.8 10/22/2015 0519   CL 112 (H) 10/22/2015 0519   CO2 23 10/22/2015 0519   GLUCOSE 145 (H) 10/22/2015 0519   BUN 14 10/22/2015 0519   CREATININE 0.88 02/17/2016 1555   CALCIUM 8.8 (L) 10/22/2015 0519   PROT 6.0 (L) 10/20/2015 1053   ALBUMIN 3.8 10/20/2015 1053   AST 21 10/20/2015 1053   ALT 16 10/20/2015 1053   ALKPHOS 40 10/20/2015 1053   BILITOT 0.6 10/20/2015 1053   GFRNONAA 60 (L) 02/17/2016 1555   GFRAA >60 02/17/2016 1555   Lab Results  Component Value Date   CHOL 119 10/21/2015   HDL 45 10/21/2015   LDLCALC 65 10/21/2015   TRIG 44 10/21/2015   CHOLHDL 2.6 10/21/2015   Lab Results  Component Value Date   HGBA1C 7.0 (H) 10/21/2015      ASSESSMENT AND PLAN  83 y.o. year old female  has a past medical history of  Coronary artery disease; Diabetes mellitus without complication ([place]);  Hyperlipidemia; Hypertension;  Pacemaker; and Stroke in March 2017 with nonocclusive thrombus in the distal basilar artery secondary to cardiogenic embolism from atrial fibrillation. The patient is a current patient of Dr. Leonie Man  who is out of the office today . This note is sent to the work in doctor.      PLAN: Continue eliquis for secondary stroke prevention  Maintain blood pressure 130/90 or less, today's  reading 138/88 continue antihypertensive medications. Diabetes with hemoglobin A1c below 6.5 continue diabetic  medication LDL cholesterol below 70 continue Crestor Stay well hydrated to prevent dehydration Continue walking routine daily, this is also good for your stress reduction Memory score is stable 30/30 Follow-up in 6 months Greater than 50% of time during this 25 minute visit was spent on counseling,explanation of diagnosis, planning of further management, discussion with patient and family and coordination of care and also discussing her new complaint of memory loss which is probably related to her ongoing depression and stress.  Dennie Bible, [facility], [facility], APRN  [facility] [address] [place], [place] [address] [phone]

## 2016-09-09 DIAGNOSIS — R509 Fever, unspecified: Secondary | ICD-10-CM | POA: Diagnosis not present

## 2016-09-14 ENCOUNTER — Telehealth: Payer: Self-pay | Admitting: Neurology

## 2016-09-14 NOTE — Telephone Encounter (Signed)
Patient's daughter is calling to advise the patient will be attending a family function late July in [place]. The elevation there is 7600 feet. Is this safe for the patient? Please call and discuss.

## 2016-09-15 NOTE — Telephone Encounter (Signed)
Traveling in airplane to go to that height is fine but if she has to go to a greater height she has to acclimatize first

## 2016-09-15 NOTE — Telephone Encounter (Signed)
Rn call patients daughter Evelyn Collins back about her mom going to [place] in late July 2018. Rn stated per Dr. Leonie Man if traveling in plane pt should be okay. Also if patient is traveling by car they need to stop every 3 to 4 hours and let pt get out and stretch and walk around. The daughter states the elevation is 7600. Rn stated per Dr.Sethi if they are driving just do it gradual and stop and let the pt get out and stretch. Pts daughter verbalized understanding.

## 2016-09-16 NOTE — Progress Notes (Signed)
I reviewed note and agree with plan.   Arlena Marsan R. Chon Buhl, MD  Certified in Neurology, Neurophysiology and Neuroimaging  [facility] [address], [address] [place], [place] [address] [phone]   

## 2016-10-26 DIAGNOSIS — Z950 Presence of cardiac pacemaker: Secondary | ICD-10-CM | POA: Diagnosis not present

## 2016-10-26 DIAGNOSIS — I359 Nonrheumatic aortic valve disorder, unspecified: Secondary | ICD-10-CM | POA: Diagnosis not present

## 2016-10-26 DIAGNOSIS — R42 Dizziness and giddiness: Secondary | ICD-10-CM | POA: Diagnosis not present

## 2016-10-26 DIAGNOSIS — I350 Nonrheumatic aortic (valve) stenosis: Secondary | ICD-10-CM | POA: Diagnosis not present

## 2016-10-26 DIAGNOSIS — I10 Essential (primary) hypertension: Secondary | ICD-10-CM | POA: Diagnosis not present

## 2016-10-26 DIAGNOSIS — I2510 Atherosclerotic heart disease of native coronary artery without angina pectoris: Secondary | ICD-10-CM | POA: Diagnosis not present

## 2016-10-26 DIAGNOSIS — Z952 Presence of prosthetic heart valve: Secondary | ICD-10-CM | POA: Diagnosis not present

## 2016-10-26 DIAGNOSIS — E119 Type 2 diabetes mellitus without complications: Secondary | ICD-10-CM | POA: Diagnosis not present

## 2016-10-26 DIAGNOSIS — E785 Hyperlipidemia, unspecified: Secondary | ICD-10-CM | POA: Diagnosis not present

## 2016-10-26 DIAGNOSIS — G459 Transient cerebral ischemic attack, unspecified: Secondary | ICD-10-CM | POA: Diagnosis not present

## 2016-10-27 DIAGNOSIS — L602 Onychogryphosis: Secondary | ICD-10-CM | POA: Diagnosis not present

## 2016-10-27 DIAGNOSIS — L600 Ingrowing nail: Secondary | ICD-10-CM | POA: Diagnosis not present

## 2016-10-27 DIAGNOSIS — E1159 Type 2 diabetes mellitus with other circulatory complications: Secondary | ICD-10-CM | POA: Diagnosis not present

## 2016-10-27 DIAGNOSIS — L84 Corns and callosities: Secondary | ICD-10-CM | POA: Diagnosis not present

## 2016-10-28 ENCOUNTER — Telehealth: Payer: Self-pay | Admitting: *Deleted

## 2016-10-28 NOTE — Telephone Encounter (Signed)
Left vm for Evelyn Collins reason for Ct scan, if her mom is having some symptoms. Rn requested a call back.

## 2016-10-28 NOTE — Telephone Encounter (Signed)
Rn receive incoming call from Evelyn Collins pts daughter in [place]. Evelyn Collins stated her mom receive a letter from Dr. Dora Sims about scheduling a CT. Pts mom was told by Hoyle Sauer NP at last visit to have the scan. Evelyn Collins stated her mom does not know why she needs the scan. Rn explain all those letters are handle by Dr.Devenshwar and test. Rn stated Dr. Leonie Man will call her to explain reason for test.

## 2016-10-28 NOTE — Telephone Encounter (Signed)
I called and left a message and TMs listed mobile number and asked her to call me back. I reviewed the patient's chart and I do not see a need for a follow-up CT angiogram but if she has further questions she could discuss this with Dr. Estanislado Pandy who ordered the test

## 2016-10-28 NOTE — Telephone Encounter (Signed)
Left vm for Evelyn Collins pts daughter about Ct scan. Pt was last seen 08/2016 by Evelyn Sauer NP. Rn left message for reason for Ct scan. Rn requested a call back.

## 2016-10-29 NOTE — Telephone Encounter (Signed)
IF patients daughter call they need to contact Anderson Malta who works with Dr.Deveshwar at [phone]. Dr.Sethi left daughter a message. Please read them Dr. Leonie Man note thanks.

## 2016-11-12 ENCOUNTER — Telehealth (HOSPITAL_COMMUNITY): Payer: Self-pay

## 2016-11-12 NOTE — Telephone Encounter (Signed)
Left a message for pt to return call. AW 

## 2016-12-02 DIAGNOSIS — I10 Essential (primary) hypertension: Secondary | ICD-10-CM | POA: Diagnosis not present

## 2016-12-02 DIAGNOSIS — I480 Paroxysmal atrial fibrillation: Secondary | ICD-10-CM | POA: Diagnosis not present

## 2016-12-02 DIAGNOSIS — E1149 Type 2 diabetes mellitus with other diabetic neurological complication: Secondary | ICD-10-CM | POA: Diagnosis not present

## 2016-12-02 DIAGNOSIS — M8589 Other specified disorders of bone density and structure, multiple sites: Secondary | ICD-10-CM | POA: Diagnosis not present

## 2016-12-03 DIAGNOSIS — E1149 Type 2 diabetes mellitus with other diabetic neurological complication: Secondary | ICD-10-CM | POA: Diagnosis not present

## 2016-12-07 DIAGNOSIS — Z780 Asymptomatic menopausal state: Secondary | ICD-10-CM | POA: Diagnosis not present

## 2016-12-07 DIAGNOSIS — M85852 Other specified disorders of bone density and structure, left thigh: Secondary | ICD-10-CM | POA: Diagnosis not present

## 2016-12-07 DIAGNOSIS — R799 Abnormal finding of blood chemistry, unspecified: Secondary | ICD-10-CM | POA: Diagnosis not present

## 2016-12-21 DIAGNOSIS — E119 Type 2 diabetes mellitus without complications: Secondary | ICD-10-CM | POA: Diagnosis not present

## 2016-12-21 DIAGNOSIS — I480 Paroxysmal atrial fibrillation: Secondary | ICD-10-CM | POA: Diagnosis not present

## 2016-12-21 DIAGNOSIS — I350 Nonrheumatic aortic (valve) stenosis: Secondary | ICD-10-CM | POA: Diagnosis not present

## 2016-12-21 DIAGNOSIS — Z952 Presence of prosthetic heart valve: Secondary | ICD-10-CM | POA: Diagnosis not present

## 2017-01-06 DIAGNOSIS — H401112 Primary open-angle glaucoma, right eye, moderate stage: Secondary | ICD-10-CM | POA: Diagnosis not present

## 2017-01-06 DIAGNOSIS — H401123 Primary open-angle glaucoma, left eye, severe stage: Secondary | ICD-10-CM | POA: Diagnosis not present

## 2017-01-11 DIAGNOSIS — Z780 Asymptomatic menopausal state: Secondary | ICD-10-CM | POA: Diagnosis not present

## 2017-01-11 DIAGNOSIS — M810 Age-related osteoporosis without current pathological fracture: Secondary | ICD-10-CM | POA: Diagnosis not present

## 2017-01-14 DIAGNOSIS — I6990 Unspecified sequelae of unspecified cerebrovascular disease: Secondary | ICD-10-CM | POA: Diagnosis not present

## 2017-01-14 DIAGNOSIS — F0150 Vascular dementia without behavioral disturbance: Secondary | ICD-10-CM | POA: Diagnosis not present

## 2017-01-14 DIAGNOSIS — F4321 Adjustment disorder with depressed mood: Secondary | ICD-10-CM | POA: Diagnosis not present

## 2017-01-19 DIAGNOSIS — Z45018 Encounter for adjustment and management of other part of cardiac pacemaker: Secondary | ICD-10-CM | POA: Diagnosis not present

## 2017-01-19 DIAGNOSIS — I495 Sick sinus syndrome: Secondary | ICD-10-CM | POA: Diagnosis not present

## 2017-01-19 DIAGNOSIS — Z950 Presence of cardiac pacemaker: Secondary | ICD-10-CM | POA: Diagnosis not present

## 2017-01-25 DIAGNOSIS — F0150 Vascular dementia without behavioral disturbance: Secondary | ICD-10-CM | POA: Diagnosis not present

## 2017-01-25 DIAGNOSIS — F4323 Adjustment disorder with mixed anxiety and depressed mood: Secondary | ICD-10-CM | POA: Diagnosis not present

## 2017-01-29 DIAGNOSIS — M810 Age-related osteoporosis without current pathological fracture: Secondary | ICD-10-CM | POA: Diagnosis not present

## 2017-01-29 DIAGNOSIS — Z780 Asymptomatic menopausal state: Secondary | ICD-10-CM | POA: Diagnosis not present

## 2017-02-01 DIAGNOSIS — H401112 Primary open-angle glaucoma, right eye, moderate stage: Secondary | ICD-10-CM | POA: Diagnosis not present

## 2017-02-01 DIAGNOSIS — H401123 Primary open-angle glaucoma, left eye, severe stage: Secondary | ICD-10-CM | POA: Diagnosis not present

## 2017-02-09 DIAGNOSIS — M810 Age-related osteoporosis without current pathological fracture: Secondary | ICD-10-CM | POA: Diagnosis not present

## 2017-02-09 DIAGNOSIS — Z780 Asymptomatic menopausal state: Secondary | ICD-10-CM | POA: Diagnosis not present

## 2017-02-22 ENCOUNTER — Ambulatory Visit: Payer: Medicare Other | Admitting: Nurse Practitioner

## 2017-03-09 DIAGNOSIS — F4321 Adjustment disorder with depressed mood: Secondary | ICD-10-CM | POA: Diagnosis not present

## 2017-03-09 DIAGNOSIS — H43813 Vitreous degeneration, bilateral: Secondary | ICD-10-CM | POA: Diagnosis not present

## 2017-03-09 DIAGNOSIS — I480 Paroxysmal atrial fibrillation: Secondary | ICD-10-CM | POA: Diagnosis not present

## 2017-03-09 DIAGNOSIS — G3184 Mild cognitive impairment, so stated: Secondary | ICD-10-CM | POA: Diagnosis not present

## 2017-03-09 DIAGNOSIS — I6990 Unspecified sequelae of unspecified cerebrovascular disease: Secondary | ICD-10-CM | POA: Diagnosis not present

## 2017-03-10 DIAGNOSIS — H353131 Nonexudative age-related macular degeneration, bilateral, early dry stage: Secondary | ICD-10-CM | POA: Diagnosis not present

## 2017-03-10 DIAGNOSIS — Z961 Presence of intraocular lens: Secondary | ICD-10-CM | POA: Diagnosis not present

## 2017-03-10 DIAGNOSIS — H43813 Vitreous degeneration, bilateral: Secondary | ICD-10-CM | POA: Diagnosis not present

## 2017-03-23 DIAGNOSIS — F4321 Adjustment disorder with depressed mood: Secondary | ICD-10-CM | POA: Diagnosis not present

## 2017-04-07 DIAGNOSIS — H401112 Primary open-angle glaucoma, right eye, moderate stage: Secondary | ICD-10-CM | POA: Diagnosis not present

## 2017-04-07 DIAGNOSIS — H401123 Primary open-angle glaucoma, left eye, severe stage: Secondary | ICD-10-CM | POA: Diagnosis not present

## 2017-04-12 DIAGNOSIS — L309 Dermatitis, unspecified: Secondary | ICD-10-CM | POA: Diagnosis not present

## 2017-04-12 DIAGNOSIS — K5900 Constipation, unspecified: Secondary | ICD-10-CM | POA: Diagnosis not present

## 2017-04-12 DIAGNOSIS — F418 Other specified anxiety disorders: Secondary | ICD-10-CM | POA: Diagnosis not present

## 2017-04-26 DIAGNOSIS — L298 Other pruritus: Secondary | ICD-10-CM | POA: Diagnosis not present

## 2017-04-26 DIAGNOSIS — L508 Other urticaria: Secondary | ICD-10-CM | POA: Diagnosis not present

## 2017-04-26 DIAGNOSIS — L308 Other specified dermatitis: Secondary | ICD-10-CM | POA: Diagnosis not present

## 2017-04-27 DIAGNOSIS — Z45010 Encounter for checking and testing of cardiac pacemaker pulse generator [battery]: Secondary | ICD-10-CM | POA: Diagnosis not present

## 2017-04-27 DIAGNOSIS — I482 Chronic atrial fibrillation: Secondary | ICD-10-CM | POA: Diagnosis not present

## 2017-06-01 DIAGNOSIS — G459 Transient cerebral ischemic attack, unspecified: Secondary | ICD-10-CM | POA: Diagnosis not present

## 2017-06-01 DIAGNOSIS — Z8673 Personal history of transient ischemic attack (TIA), and cerebral infarction without residual deficits: Secondary | ICD-10-CM | POA: Diagnosis not present

## 2017-06-01 DIAGNOSIS — Z7984 Long term (current) use of oral hypoglycemic drugs: Secondary | ICD-10-CM | POA: Diagnosis not present

## 2017-06-01 DIAGNOSIS — E119 Type 2 diabetes mellitus without complications: Secondary | ICD-10-CM | POA: Diagnosis not present

## 2017-06-01 DIAGNOSIS — E785 Hyperlipidemia, unspecified: Secondary | ICD-10-CM | POA: Diagnosis not present

## 2017-06-01 DIAGNOSIS — E1165 Type 2 diabetes mellitus with hyperglycemia: Secondary | ICD-10-CM | POA: Diagnosis not present

## 2017-06-01 DIAGNOSIS — Z950 Presence of cardiac pacemaker: Secondary | ICD-10-CM | POA: Diagnosis not present

## 2017-06-01 DIAGNOSIS — I4891 Unspecified atrial fibrillation: Secondary | ICD-10-CM | POA: Diagnosis not present

## 2017-06-01 DIAGNOSIS — Z743 Need for continuous supervision: Secondary | ICD-10-CM | POA: Diagnosis not present

## 2017-06-01 DIAGNOSIS — G2581 Restless legs syndrome: Secondary | ICD-10-CM | POA: Diagnosis not present

## 2017-06-01 DIAGNOSIS — Z951 Presence of aortocoronary bypass graft: Secondary | ICD-10-CM | POA: Diagnosis not present

## 2017-06-01 DIAGNOSIS — R531 Weakness: Secondary | ICD-10-CM | POA: Diagnosis not present

## 2017-06-01 DIAGNOSIS — I10 Essential (primary) hypertension: Secondary | ICD-10-CM | POA: Diagnosis not present

## 2017-06-01 DIAGNOSIS — F329 Major depressive disorder, single episode, unspecified: Secondary | ICD-10-CM | POA: Diagnosis not present

## 2017-06-01 DIAGNOSIS — Z7901 Long term (current) use of anticoagulants: Secondary | ICD-10-CM | POA: Diagnosis not present

## 2017-06-01 DIAGNOSIS — F419 Anxiety disorder, unspecified: Secondary | ICD-10-CM | POA: Diagnosis not present

## 2017-06-01 DIAGNOSIS — R42 Dizziness and giddiness: Secondary | ICD-10-CM | POA: Diagnosis not present

## 2017-06-02 DIAGNOSIS — G459 Transient cerebral ischemic attack, unspecified: Secondary | ICD-10-CM | POA: Diagnosis not present

## 2017-06-02 DIAGNOSIS — I4891 Unspecified atrial fibrillation: Secondary | ICD-10-CM | POA: Diagnosis not present

## 2017-06-02 DIAGNOSIS — I10 Essential (primary) hypertension: Secondary | ICD-10-CM | POA: Diagnosis not present

## 2017-06-02 DIAGNOSIS — F339 Major depressive disorder, recurrent, unspecified: Secondary | ICD-10-CM | POA: Diagnosis not present

## 2017-06-02 DIAGNOSIS — R42 Dizziness and giddiness: Secondary | ICD-10-CM | POA: Diagnosis not present

## 2017-06-02 DIAGNOSIS — E161 Other hypoglycemia: Secondary | ICD-10-CM | POA: Diagnosis not present

## 2017-06-03 DIAGNOSIS — E161 Other hypoglycemia: Secondary | ICD-10-CM | POA: Diagnosis not present

## 2017-06-03 DIAGNOSIS — Z953 Presence of xenogenic heart valve: Secondary | ICD-10-CM | POA: Diagnosis not present

## 2017-06-03 DIAGNOSIS — G459 Transient cerebral ischemic attack, unspecified: Secondary | ICD-10-CM | POA: Diagnosis not present

## 2017-06-03 DIAGNOSIS — F339 Major depressive disorder, recurrent, unspecified: Secondary | ICD-10-CM | POA: Diagnosis not present

## 2017-06-03 DIAGNOSIS — I350 Nonrheumatic aortic (valve) stenosis: Secondary | ICD-10-CM | POA: Diagnosis not present

## 2017-06-03 DIAGNOSIS — I4891 Unspecified atrial fibrillation: Secondary | ICD-10-CM | POA: Diagnosis not present

## 2017-06-03 DIAGNOSIS — R42 Dizziness and giddiness: Secondary | ICD-10-CM | POA: Diagnosis not present

## 2017-06-22 DIAGNOSIS — F329 Major depressive disorder, single episode, unspecified: Secondary | ICD-10-CM | POA: Diagnosis not present

## 2017-06-22 DIAGNOSIS — I350 Nonrheumatic aortic (valve) stenosis: Secondary | ICD-10-CM | POA: Diagnosis not present

## 2017-06-22 DIAGNOSIS — I10 Essential (primary) hypertension: Secondary | ICD-10-CM | POA: Diagnosis not present

## 2017-06-22 DIAGNOSIS — M1990 Unspecified osteoarthritis, unspecified site: Secondary | ICD-10-CM | POA: Diagnosis not present

## 2017-06-30 DIAGNOSIS — M810 Age-related osteoporosis without current pathological fracture: Secondary | ICD-10-CM | POA: Diagnosis not present

## 2017-06-30 DIAGNOSIS — Z23 Encounter for immunization: Secondary | ICD-10-CM | POA: Diagnosis not present

## 2017-07-05 DIAGNOSIS — Z952 Presence of prosthetic heart valve: Secondary | ICD-10-CM | POA: Diagnosis not present

## 2017-07-05 DIAGNOSIS — I10 Essential (primary) hypertension: Secondary | ICD-10-CM | POA: Diagnosis not present

## 2017-07-05 DIAGNOSIS — Z950 Presence of cardiac pacemaker: Secondary | ICD-10-CM | POA: Diagnosis not present

## 2017-07-05 DIAGNOSIS — I480 Paroxysmal atrial fibrillation: Secondary | ICD-10-CM | POA: Diagnosis not present

## 2017-07-06 DIAGNOSIS — H401123 Primary open-angle glaucoma, left eye, severe stage: Secondary | ICD-10-CM | POA: Diagnosis not present

## 2017-07-06 DIAGNOSIS — H401112 Primary open-angle glaucoma, right eye, moderate stage: Secondary | ICD-10-CM | POA: Diagnosis not present

## 2017-07-08 DIAGNOSIS — E119 Type 2 diabetes mellitus without complications: Secondary | ICD-10-CM | POA: Diagnosis not present

## 2017-07-08 DIAGNOSIS — I10 Essential (primary) hypertension: Secondary | ICD-10-CM | POA: Diagnosis not present

## 2017-07-08 DIAGNOSIS — K5900 Constipation, unspecified: Secondary | ICD-10-CM | POA: Diagnosis not present

## 2017-07-08 DIAGNOSIS — M859 Disorder of bone density and structure, unspecified: Secondary | ICD-10-CM | POA: Diagnosis not present

## 2017-07-08 DIAGNOSIS — E785 Hyperlipidemia, unspecified: Secondary | ICD-10-CM | POA: Diagnosis not present

## 2017-07-14 DIAGNOSIS — H401112 Primary open-angle glaucoma, right eye, moderate stage: Secondary | ICD-10-CM | POA: Diagnosis not present

## 2017-07-14 DIAGNOSIS — H401123 Primary open-angle glaucoma, left eye, severe stage: Secondary | ICD-10-CM | POA: Diagnosis not present

## 2017-08-04 DIAGNOSIS — I482 Chronic atrial fibrillation: Secondary | ICD-10-CM | POA: Diagnosis not present

## 2017-08-04 DIAGNOSIS — Z45010 Encounter for checking and testing of cardiac pacemaker pulse generator [battery]: Secondary | ICD-10-CM | POA: Diagnosis not present

## 2017-08-16 DIAGNOSIS — H401112 Primary open-angle glaucoma, right eye, moderate stage: Secondary | ICD-10-CM | POA: Diagnosis not present

## 2017-08-16 DIAGNOSIS — H401123 Primary open-angle glaucoma, left eye, severe stage: Secondary | ICD-10-CM | POA: Diagnosis not present

## 2017-09-14 DIAGNOSIS — H1045 Other chronic allergic conjunctivitis: Secondary | ICD-10-CM | POA: Diagnosis not present

## 2017-09-14 DIAGNOSIS — H401123 Primary open-angle glaucoma, left eye, severe stage: Secondary | ICD-10-CM | POA: Diagnosis not present

## 2017-09-14 DIAGNOSIS — H401112 Primary open-angle glaucoma, right eye, moderate stage: Secondary | ICD-10-CM | POA: Diagnosis not present

## 2017-10-05 DIAGNOSIS — H401123 Primary open-angle glaucoma, left eye, severe stage: Secondary | ICD-10-CM | POA: Diagnosis not present

## 2017-10-05 DIAGNOSIS — H401112 Primary open-angle glaucoma, right eye, moderate stage: Secondary | ICD-10-CM | POA: Diagnosis not present

## 2017-11-08 DIAGNOSIS — I482 Chronic atrial fibrillation: Secondary | ICD-10-CM | POA: Diagnosis not present

## 2017-11-08 DIAGNOSIS — Z45010 Encounter for checking and testing of cardiac pacemaker pulse generator [battery]: Secondary | ICD-10-CM | POA: Diagnosis not present

## 2017-12-20 IMAGING — CT CT HEAD W/O CM
1 series · 16 of 30 positions shown, 20 images · non-contrast
Comparison: None.

CLINICAL DATA: PT. WOKE THIS AM WITH ROOM SPINNING, NAUSEA,
DYSPHASIA WHICH HAS RESOLVED, VOMITING DURING TRANSPORT TO CT, BOREN

EXAM:
CT HEAD WITHOUT CONTRAST
TECHNIQUE: Contiguous axial images were obtained from the base of the skull
through the vertex without intravenous contrast.

[Series 2: head 5.0 h30s · axial · 0.42mm/px · z∈[-239,-104]mm · 16 of 31 slices shown, 20 images]
[im 2/31  brain]
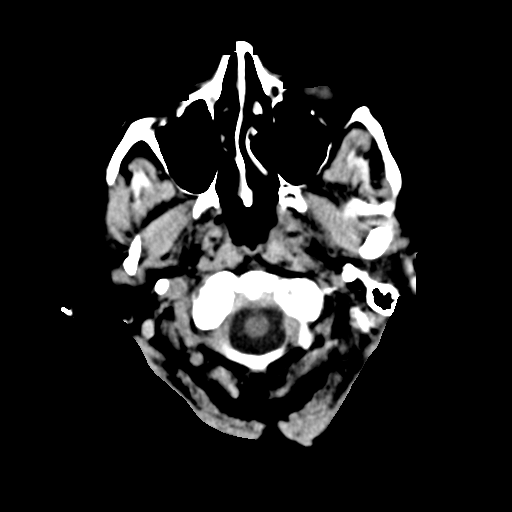
[im 2/31  bone]
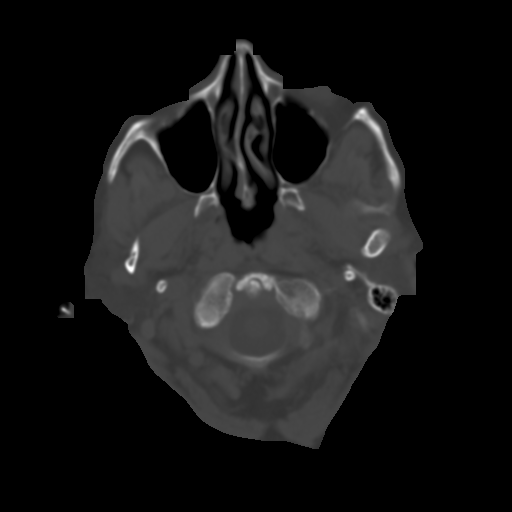
[im 4/31  brain]
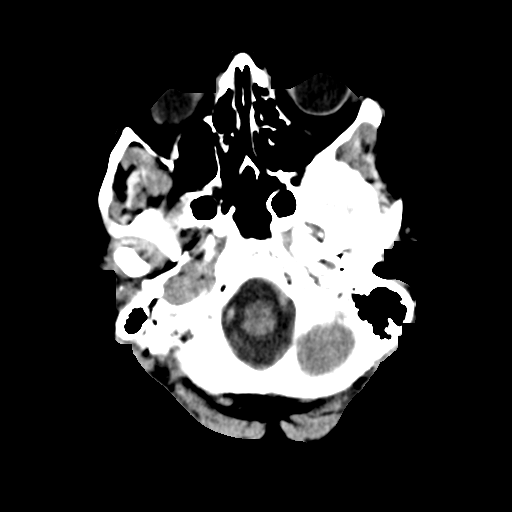
[im 6/31  brain]
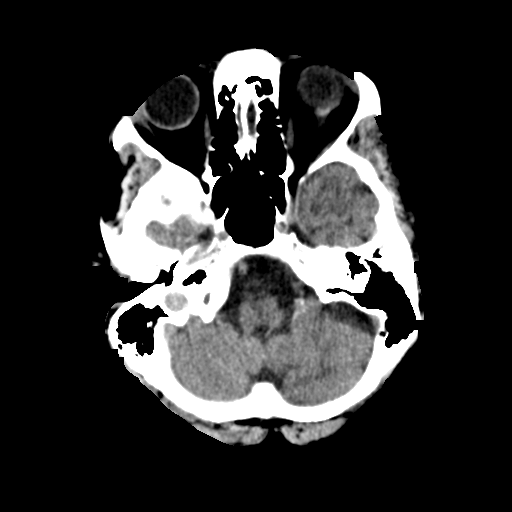
[im 8/31  brain]
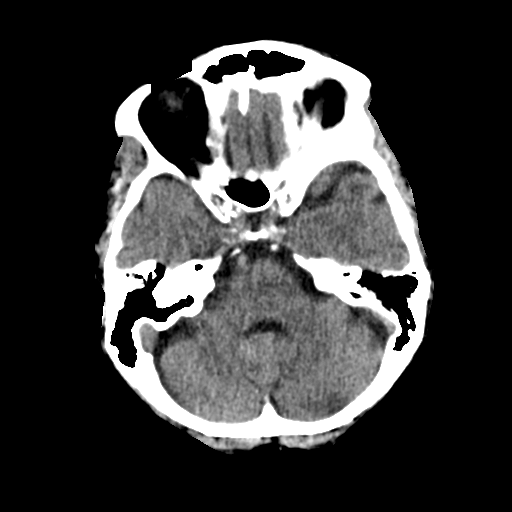
[im 9/31  brain]
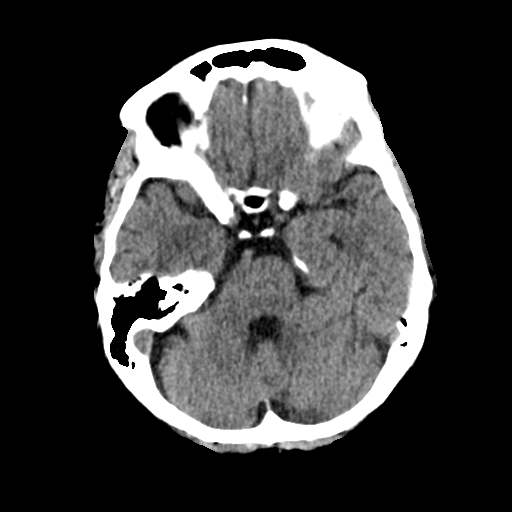
[im 9/31  bone]
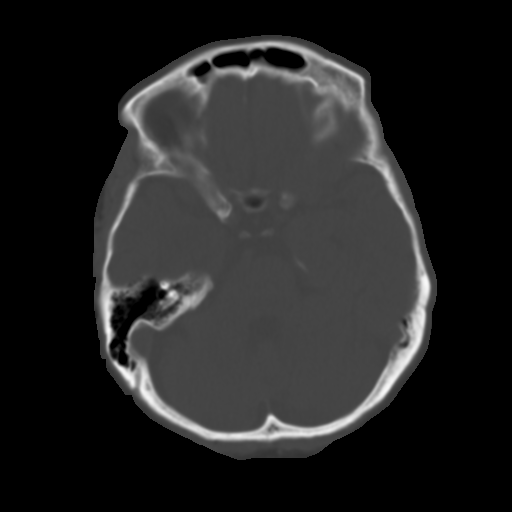
[im 11/31  brain]
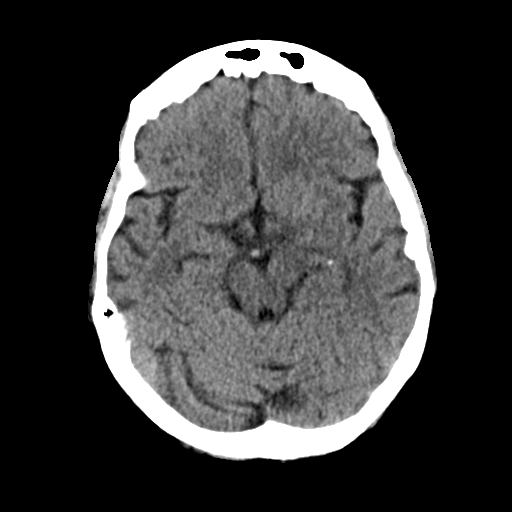
[im 13/31  brain]
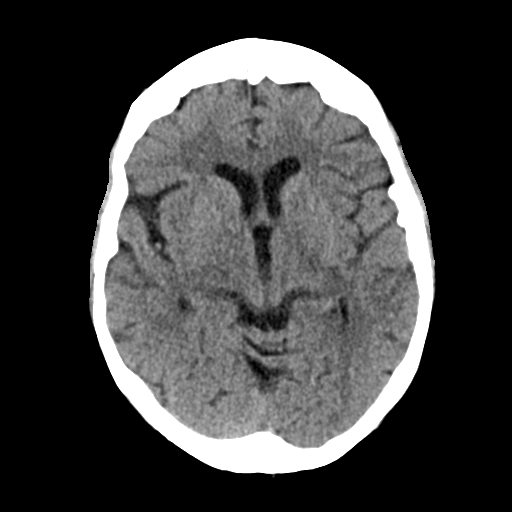
[im 15/31  brain]
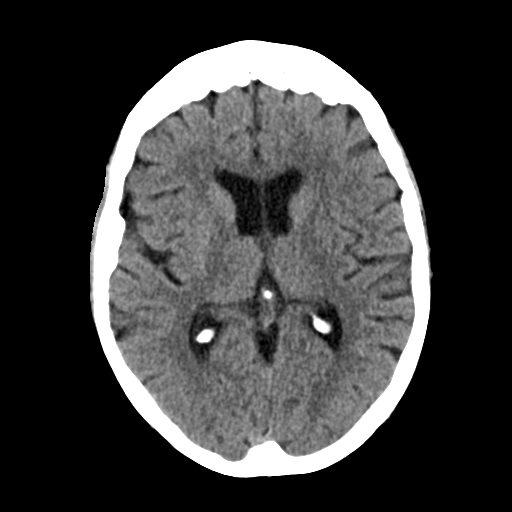
[im 16/31  brain]
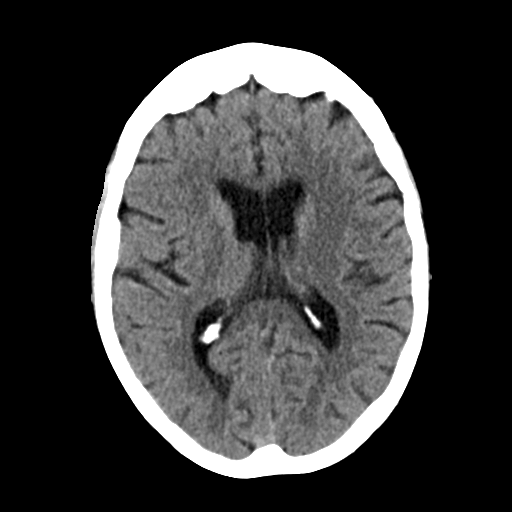
[im 16/31  bone]
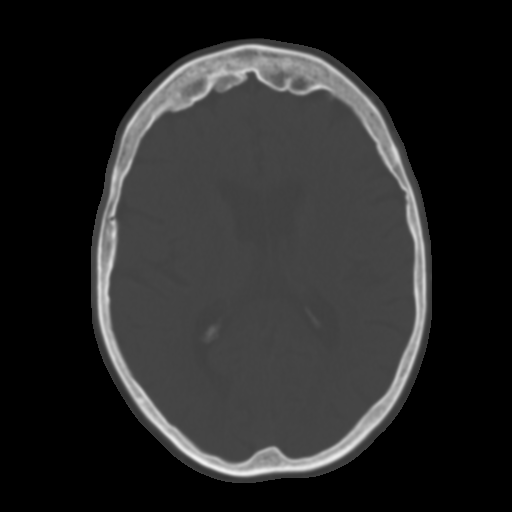
[im 18/31  brain]
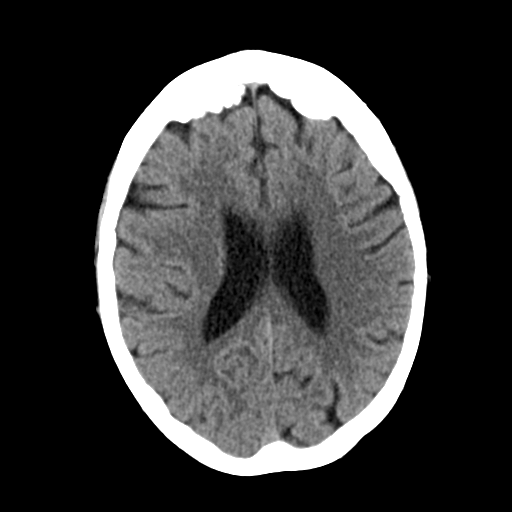
[im 20/31  brain]
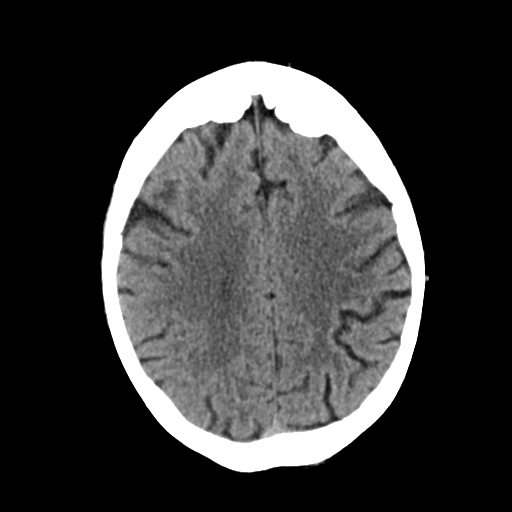
[im 22/31  brain]
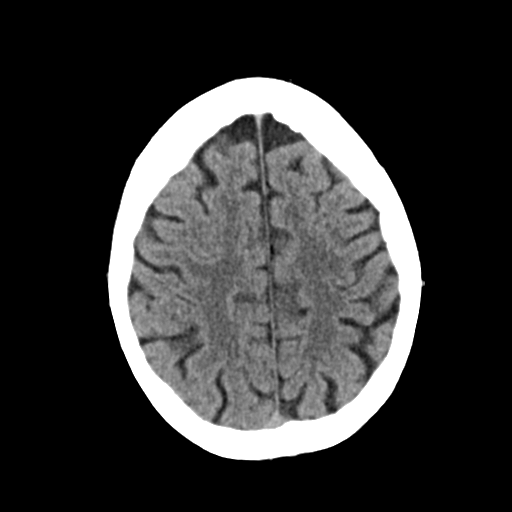
[im 23/31  brain]
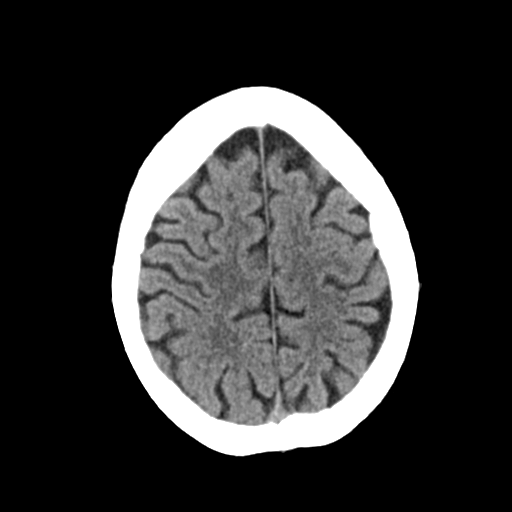
[im 23/31  bone]
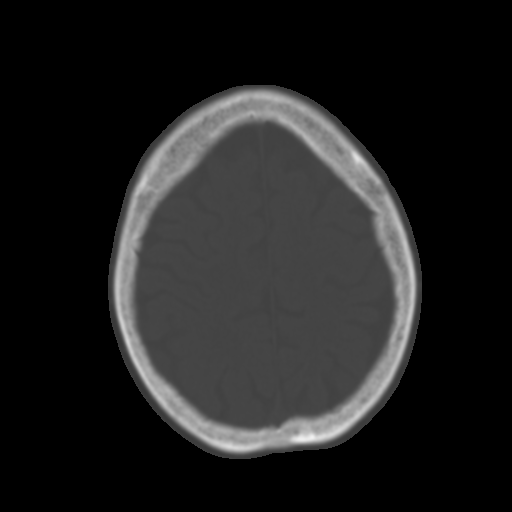
[im 25/31  brain]
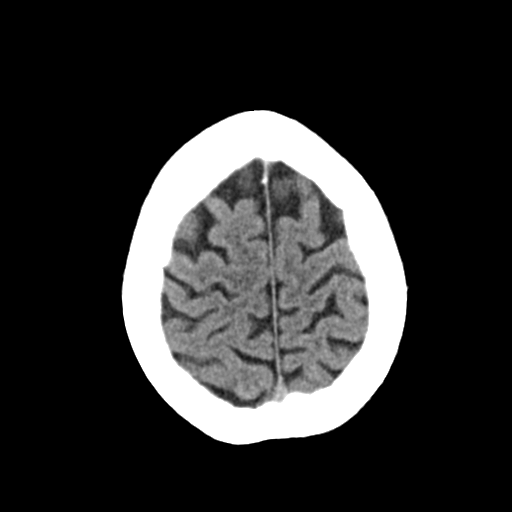
[im 27/31  brain]
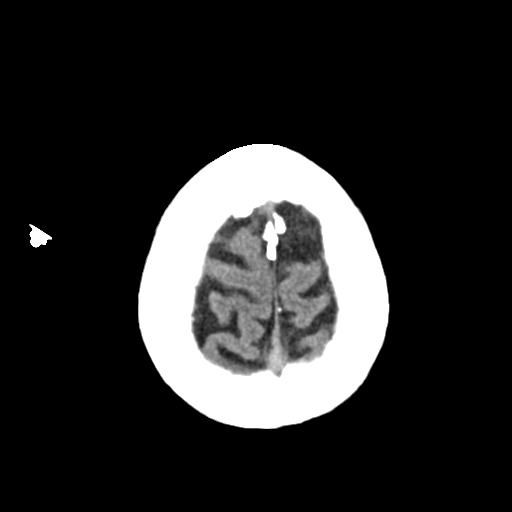
[im 29/31  brain]
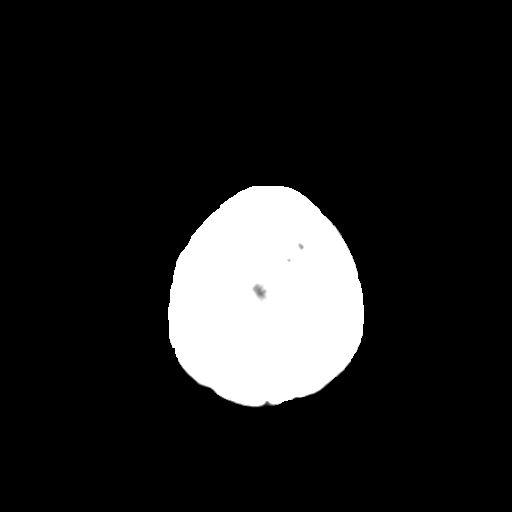

[16 of 30 positions shown; findings below may reference images not displayed]

FINDINGS: There is mild central cortical atrophy. There is no intra or
extra-axial fluid collection or mass lesion. The basilar cisterns
and ventricles have a normal appearance. There is no CT evidence for
acute infarction or hemorrhage. Bone windows are unremarkable.
IMPRESSION: 1. Atrophy.
2.  No evidence for acute intracranial abnormality.

## 2017-12-20 IMAGING — CR DG CHEST 2V
2 series · 2 of 2 positions shown · non-contrast
Comparison: December 18, 2013

CLINICAL DATA: Pain following fall.  Slurred speech.

EXAM:
CHEST  2 VIEW

[chest lat]
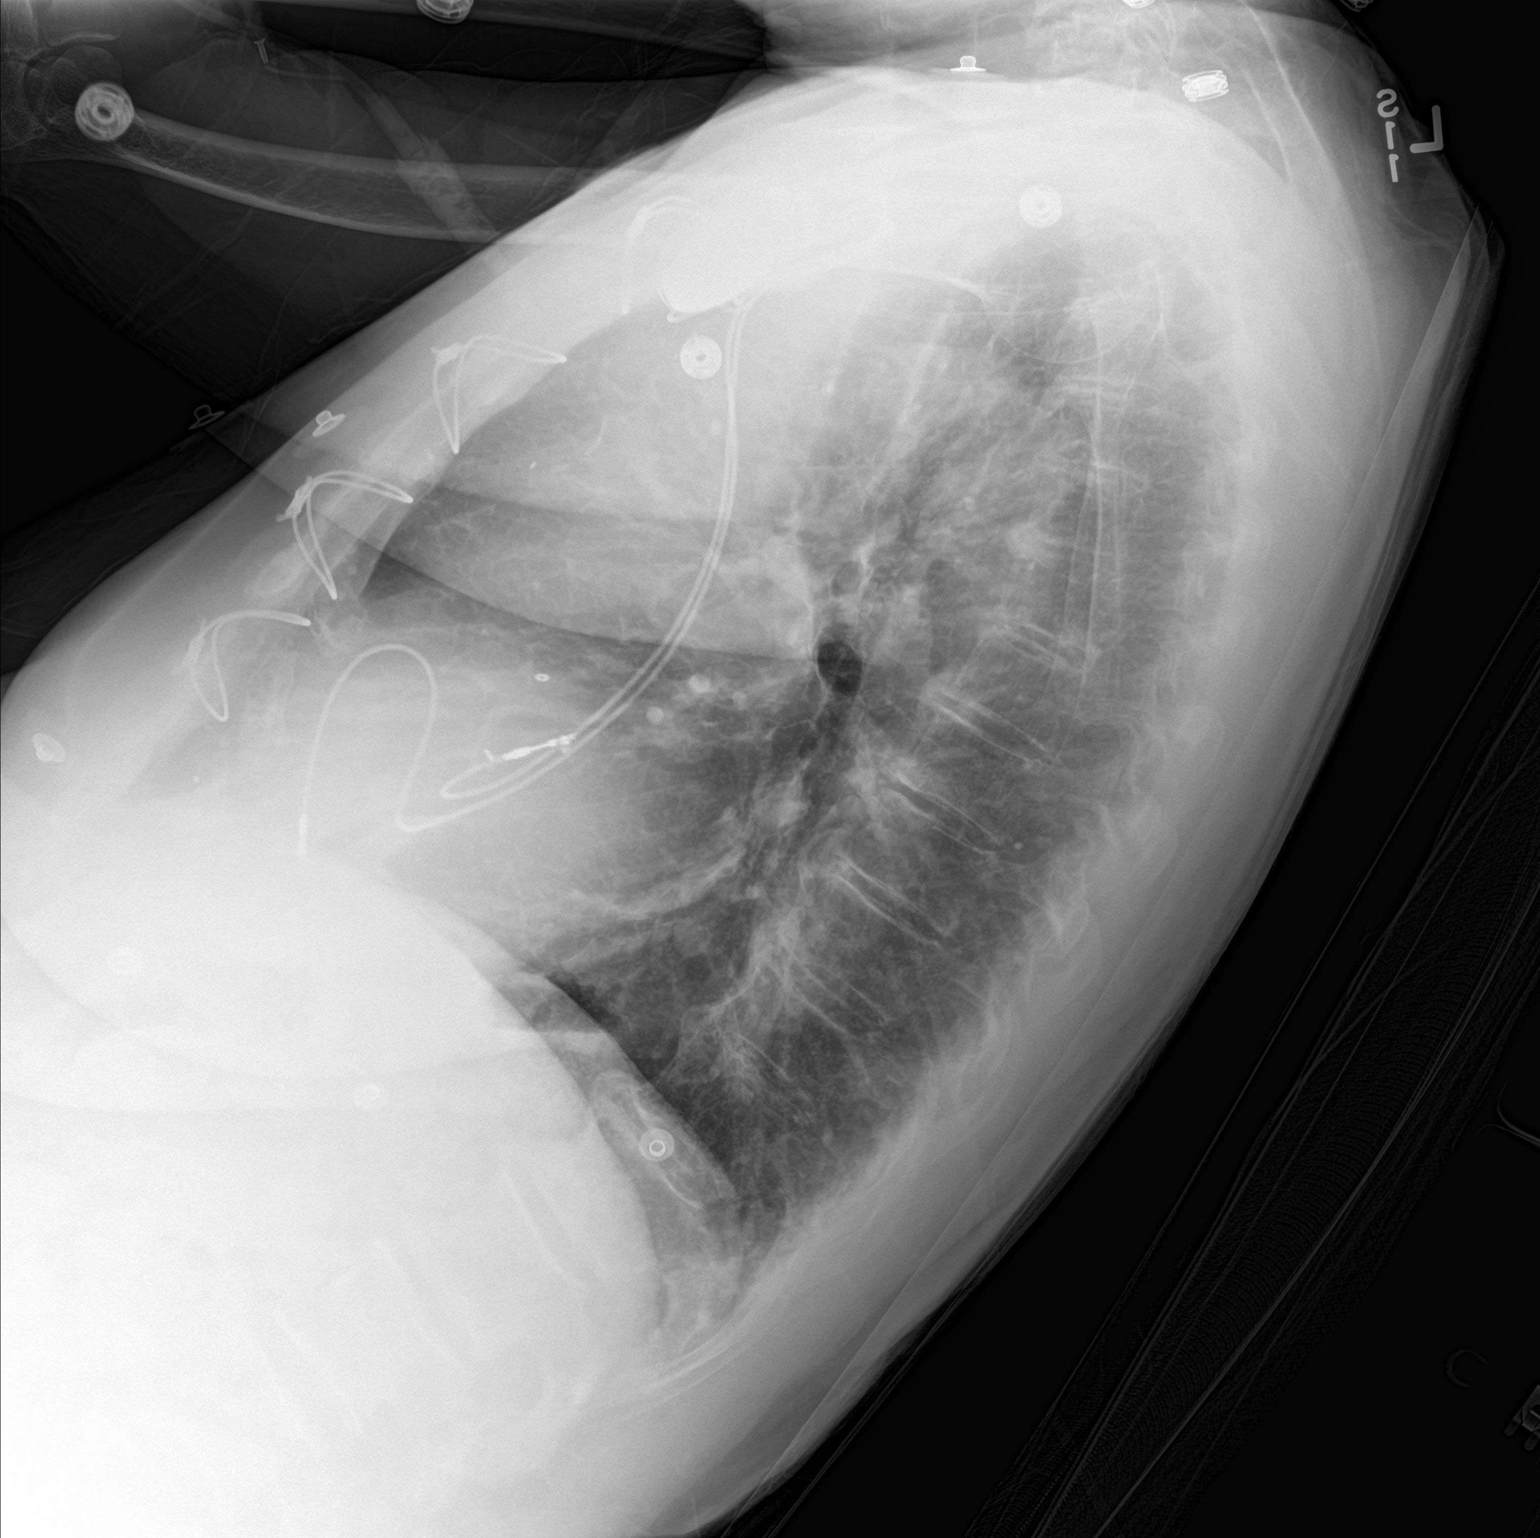

[chest ap]
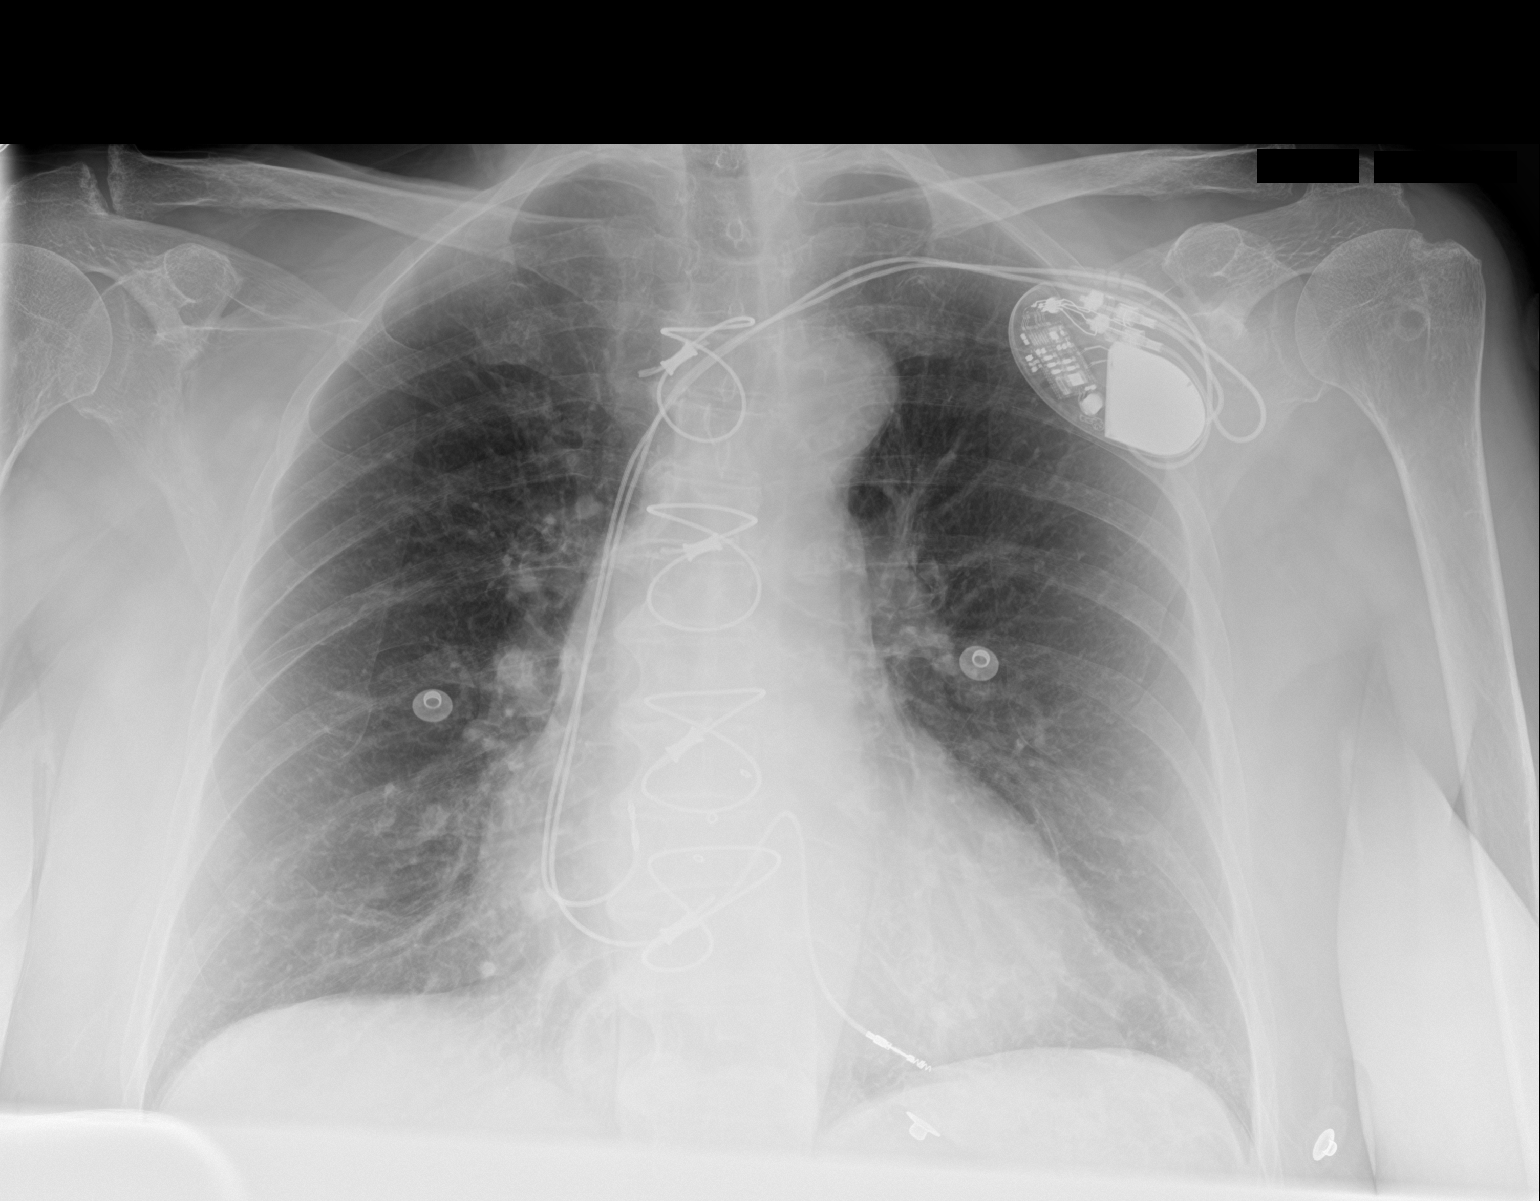

[2 of 2 positions shown; findings below may reference images not displayed]

FINDINGS: There is no appreciable edema or consolidation. The heart size is
upper normal with pulmonary vascularity within normal limits.
Patient is status post median sternotomy. There is a pacemaker with
leads attached to the right atrium and right ventricle, stable. No
adenopathy. No pneumothorax. There is degenerative change in the
thoracic spine.
IMPRESSION: No edema or consolidation.  No change in cardiac silhouette.

## 2018-01-31 ENCOUNTER — Telehealth: Payer: Self-pay

## 2018-01-31 NOTE — Telephone Encounter (Signed)
PT can be schedule with Evelyn Sauer NP when they call back to schedule. Pt saw Evelyn Sauer NP last.

## 2018-02-01 ENCOUNTER — Telehealth: Payer: Self-pay

## 2018-02-01 NOTE — Telephone Encounter (Signed)
Form given to medical records that pt lives in [place] with daughter POA Katlynne Mckercher during the year. She stays in [place] during 01/27/2018 to 04/08/2018. If she needs treatment records can be made available to our office. Pt has a cardiologist Dr. Burnice Logan, Dr .Johnnette Gourd, neurologist, PCP, Cato Mulligan, and Dr.Stephen Goode,opthalmologist, and Dr. Avon Gully optometrist all in [place]. HCPOA forms scan in. LEtter sent to medical records. Hilda Blades is aware of this.

## 2018-03-11 ENCOUNTER — Encounter: Payer: Self-pay | Admitting: Emergency Medicine

## 2018-03-11 ENCOUNTER — Other Ambulatory Visit: Payer: Self-pay

## 2018-03-11 ENCOUNTER — Emergency Department (INDEPENDENT_AMBULATORY_CARE_PROVIDER_SITE_OTHER)
Admission: EM | Admit: 2018-03-11 | Discharge: 2018-03-11 | Disposition: A | Discharge status: Home / Self Care | Payer: Medicare Other | Source: Home / Self Care | Attending: Family Medicine | Admitting: Family Medicine

## 2018-03-11 ENCOUNTER — Emergency Department (INDEPENDENT_AMBULATORY_CARE_PROVIDER_SITE_OTHER): Payer: Medicare Other

## 2018-03-11 DIAGNOSIS — R0602 Shortness of breath: Secondary | ICD-10-CM

## 2018-03-11 DIAGNOSIS — R05 Cough: Secondary | ICD-10-CM

## 2018-03-11 DIAGNOSIS — J069 Acute upper respiratory infection, unspecified: Secondary | ICD-10-CM

## 2018-03-11 DIAGNOSIS — B9789 Other viral agents as the cause of diseases classified elsewhere: Secondary | ICD-10-CM

## 2018-03-11 MED ORDER — PREDNISONE 20 MG PO TABS: 20.0000 mg | ORAL_TABLET | Freq: Every day | ORAL | 0 refills | Status: DC

## 2018-03-11 MED ORDER — IPRATROPIUM-ALBUTEROL 0.5-2.5 (3) MG/3ML IN SOLN
3.0000 mL | Freq: Once | RESPIRATORY_TRACT | Status: AC
  Administered 2018-03-11: 3 mL via RESPIRATORY_TRACT

## 2018-03-11 MED ORDER — PREDNISONE 20 MG PO TABS: 20.0000 mg | ORAL_TABLET | Freq: Every day | ORAL | 0 refills | Status: AC

## 2018-03-11 MED ORDER — BENZONATATE 100 MG PO CAPS: 100.0000 mg | ORAL_CAPSULE | Freq: Three times a day (TID) | ORAL | 0 refills | Status: AC

## 2018-03-11 MED ORDER — IPRATROPIUM BROMIDE 0.06 % NA SOLN: 2.0000 | Freq: Four times a day (QID) | NASAL | 1 refills | Status: AC

## 2018-03-11 NOTE — ED Provider Notes (Signed)
[facility]    CSN: [id number] Arrival date & time: 03/11/18  1849     History   Chief Complaint Chief Complaint  Patient presents with  . Cough    HPI Evelyn Collins is a 84 y.o. female.   HPI  Evelyn Collins is a 84 y.o. female presenting to UC with c/o cough and congestion, worsening over the last 1 week.  She is visiting from [place], where her PCP is aware her Losartan may be causing a cough but believe that is the best medication for her HTN at this time.  She does not return to [place] for another 1 month.  Cough is mildly productive. Mild temporary improvement with her albuterol inhaler. Denies fever, chills, n/v/d. Mild chest tightness.    Past Medical History:  Diagnosis Date  . Allergy to pollen   . Arthritis   . Asthma   . Constipation   . Coronary artery disease   . Diabetes mellitus without complication ([place])   . Glaucoma   . Glaucoma   . H/O aortic valve replacement   . Hyperlipidemia   . Hypertension   . Inguinal hernia   . Pacemaker   . Stroke [facility]) 2010ish   no residual    Patient Active Problem List   Diagnosis Date Noted  . Hyperlipemia 08/24/2016  . Cerebellar infarct ([place]) 10/28/2015  . Depression 10/28/2015  . Acute onset of severe vertigo   . Cerebral thrombosis with cerebral infarction 10/21/2015  . TIA (transient ischemic attack) 10/20/2015  . Stroke-like symptoms 10/20/2015  . 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 Diabetes mellitus without complication ([place])   . Hypertension   . Pacemaker   . Coronary artery disease   . Asthma   . Stroke [facility])     Past Surgical History:  Procedure Laterality Date  . AORTIC VALVE REPLACEMENT  2006  . CORONARY ARTERY BYPASS GRAFT  2006  . EXPLORATION POST OPERATIVE OPEN HEART    . EYE SURGERY Bilateral    cataract  . HERNIA REPAIR     umbilical hernia x2  . INGUINAL HERNIA REPAIR Right 12/21/2013   Procedure: HERNIA REPAIR INGUINAL ADULT;  Surgeon: Odis Hollingshead, MD;  Location:  [place];  Service: General;  Laterality: Right;  . INSERTION OF MESH Right 12/21/2013   Procedure: INSERTION OF MESH;  Surgeon: Odis Hollingshead, MD;  Location: [place];  Service: General;  Laterality: Right;  . RADIOLOGY WITH ANESTHESIA N/A 10/20/2015   Procedure: RADIOLOGY WITH ANESTHESIA;  Surgeon: Luanne Bras, MD;  Location: [place];  Service: Radiology;  Laterality: N/A;  . TONSILLECTOMY      OB History   None      Home Medications    Prior to Admission medications   Medication Sig Start Date End Date Taking? Authorizing Provider  ACCU-CHEK FASTCLIX LANCETS MISC by Does not apply route.    [provider]  albuterol (PROVENTIL HFA;VENTOLIN HFA) 108 (90 BASE) MCG/ACT inhaler Inhale 2 puffs into the lungs every 4 (four) hours as needed for shortness of breath (cough). Reported on 10/25/2015    [provider]  alendronate (FOSAMAX) 70 MG tablet Take 70 mg by mouth every Monday. Take with a full glass of water on an empty stomach.    [provider]  apixaban (ELIQUIS) 5 MG TABS tablet Take 1 tablet (5 mg total) by mouth 2 (two) times daily. 10/22/15   Allie Bossier, MD  benzonatate (TESSALON) 100 MG capsule Take  1-2 capsules (100-200 mg total) by mouth every 8 (eight) hours. 03/11/18   Noe Gens, PA-C  Cholecalciferol (VITAMIN D) 2000 UNITS CAPS Take 5,000 Units by mouth every morning.     [provider]  docusate sodium (COLACE) 100 MG capsule Take 100 mg by mouth 2 (two) times daily.    [provider]  dorzolamide-timolol (COSOPT) 22.3-6.8 MG/ML ophthalmic solution Place 1 drop into both eyes 2 (two) times daily.    [provider]  Glucos-Chondroit-Hyaluron-MSM (GLUCOSAMINE CHONDROITIN JOINT PO) Take 1 tablet by mouth 2 (two) times daily.     [provider]  ipratropium (ATROVENT) 0.06 % nasal spray Place 2 sprays into both nostrils 4 (four) times daily. 03/11/18   Noe Gens, PA-C  latanoprost (XALATAN) 0.005 %  ophthalmic solution Place 1 drop into both eyes at bedtime.    [provider]  loratadine-pseudoephedrine (CLARITIN-D 12-HOUR) 5-120 MG tablet Take 1 tablet by mouth 2 (two) times daily as needed.     [provider]  losartan (COZAAR) 25 MG tablet Take 0.5 tablets (12.5 mg total) by mouth every morning. Patient taking differently: Take 25 mg by mouth every morning. Take one daily 10/22/15   Allie Bossier, MD  metFORMIN (GLUCOPHAGE) 1000 MG tablet Take 1,000 mg by mouth 2 (two) times daily with a meal.    [provider]  Multiple Vitamin (MULTIVITAMIN WITH MINERALS) TABS tablet Take 1 tablet by mouth every other day.    [provider]  Omega-3 Fatty Acids (FISH OIL) 1200 MG CAPS Take 1,200 mg by mouth every morning.    [provider]  predniSONE (DELTASONE) 20 MG tablet Take 1 tablet (20 mg total) by mouth daily with breakfast. 03/11/18   Noe Gens, PA-C  rosuvastatin (CRESTOR) 20 MG tablet Take 10-20 mg by mouth every evening. Alternates taking 0.5 tablet and 1 tablet daily    [provider]    Family History History reviewed. No pertinent family history.  Social History Social History   Tobacco Use  . Smoking status: Never Smoker  . Smokeless tobacco: Never Used  Substance Use Topics  . Alcohol use: No  . Drug use: No     Allergies   Other   Review of Systems Review of Systems  Constitutional: Negative for chills and fever.  HENT: Positive for congestion, postnasal drip and rhinorrhea. Negative for ear pain, sore throat, trouble swallowing and voice change.   Respiratory: Positive for cough and chest tightness. Negative for shortness of breath.   Cardiovascular: Negative for chest pain and palpitations.  Gastrointestinal: Negative for abdominal pain, diarrhea, nausea and vomiting.  Musculoskeletal: Negative for arthralgias, back pain and myalgias.  Skin: Negative for rash.     Physical Exam Triage Vital  Signs ED Triage Vitals [03/11/18 1915]  Enc Vitals Group     BP (!) 152/77     Pulse Rate 75     Resp      Temp 99 F (37.2 C)     Temp Source Oral     SpO2 99 %     Weight 160 lb (72.6 kg)     Height 5\' 7"  (1.702 m)     Head Circumference      Peak Flow      Pain Score 3     Pain Loc      Pain Edu?      Excl. in [place]?    No data found.  Updated Vital Signs  BP (!) 152/77 (BP Location: Left Arm)   Pulse 75   Temp 99 F (37.2 C) (Oral)   Ht 5\' 7"  (1.702 m)   Wt 160 lb (72.6 kg)   SpO2 99%   BMI 25.06 kg/m   Visual Acuity Right Eye Distance:   Left Eye Distance:   Bilateral Distance:    Right Eye Near:   Left Eye Near:    Bilateral Near:     Physical Exam  Constitutional: She is oriented to person, place, and time. She appears well-developed and well-nourished. No distress.  HENT:  Head: Normocephalic and atraumatic.  Right Ear: Tympanic membrane normal.  Left Ear: Tympanic membrane normal.  Nose: Nose normal.  Mouth/Throat: Uvula is midline, oropharynx is clear and moist and mucous membranes are normal.  Eyes: EOM are normal.  Neck: Normal range of motion. Neck supple.  Cardiovascular: Normal rate and regular rhythm.  Murmur heard. Pulmonary/Chest: Effort normal. No stridor. No respiratory distress. She has wheezes. She has no rales.  Musculoskeletal: Normal range of motion.  Neurological: She is alert and oriented to person, place, and time.  Skin: Skin is warm and dry. She is not diaphoretic.  Psychiatric: She has a normal mood and affect. Her behavior is normal.  Nursing note and vitals reviewed.    UC Treatments / Results  Labs (all labs ordered are listed, but only abnormal results are displayed) Labs Reviewed - No data to display  EKG None  Radiology Dg Chest 2 View  Result Date: 03/11/2018 CLINICAL DATA:  Cough and shortness of breath for 5 days. EXAM: CHEST - 2 VIEW COMPARISON:  10/20/2015 FINDINGS: There changes from previous CABG surgery.  Cardiac silhouette is normal in size. No mediastinal or hilar masses. No evidence of adenopathy. Left anterior chest wall sequential pacemaker is stable and well positioned. Lungs are mildly hyperexpanded, but clear. No pleural effusion or pneumothorax. Skeletal structures are demineralized but intact. IMPRESSION: No active cardiopulmonary disease. Electronically Signed   By: Lajean Manes M.D.   On: 03/11/2018 20:03    Procedures Procedures (including critical care time)  Medications Ordered in UC Medications  ipratropium-albuterol (DUONEB) 0.5-2.5 (3) MG/3ML nebulizer solution 3 mL (3 mLs Nebulization Given 03/11/18 2006)    Initial Impression / Assessment and Plan / UC Course  I have reviewed the triage vital signs and the nursing notes.  Pertinent labs & imaging results that were available during my care of the patient were reviewed by me and considered in my medical decision making (see chart for details).     CXR- no evidence of pneumonia or bronchitis. Will tx symptomatically  Mild to moderate improvement with the duoneb Pt cautious about prednisone due to her DM. Encouraged f/u with PCP when she returns home, but may return to UC while she is here if needed.   Final Clinical Impressions(s) / UC Diagnoses   Final diagnoses:  Viral URI with cough   Discharge Instructions   None    ED Prescriptions    Medication Sig Dispense Auth. Provider   benzonatate (TESSALON) 100 MG capsule Take 1-2 capsules (100-200 mg total) by mouth every 8 (eight) hours. 21 capsule Gerarda Fraction, Naje Rice O, PA-C   predniSONE (DELTASONE) 20 MG tablet  (Status: Discontinued) Take 1 tablet (20 mg total) by mouth daily with breakfast. 3 tabs po day one, then 2 po daily x 4 days 3 tablet Lundon Rosier O, PA-C   ipratropium (ATROVENT) 0.06 % nasal spray Place 2 sprays into both nostrils 4 (  four) times daily. 15 mL Shelma Eiben O, PA-C   predniSONE (DELTASONE) 20 MG tablet Take 1 tablet (20 mg total) by mouth daily  with breakfast. 3 tablet Noe Gens, PA-C     Controlled Substance Prescriptions [place] Controlled Substance Registry consulted? Not Applicable   Tyrell Antonio 03/12/18 [id number]

## 2018-03-11 NOTE — ED Triage Notes (Signed)
Pt c/o of cough and congestion. States she lives in [place] and has been seen by her PCP there for her "Losartan" cough but is still taking the medications while visiting. Her cough is productive.
